# Patient Record
Sex: Female | Born: 1970 | Race: White | Hispanic: No | Marital: Married | Smoking: Former smoker
Health system: Southern US, Community
[De-identification: ages and names within clinical notes are randomized; demographics above are authoritative.]

## PROBLEM LIST (undated history)

## (undated) DIAGNOSIS — F32A Depression, unspecified: Secondary | ICD-10-CM

## (undated) DIAGNOSIS — F329 Major depressive disorder, single episode, unspecified: Secondary | ICD-10-CM

## (undated) DIAGNOSIS — A64 Unspecified sexually transmitted disease: Secondary | ICD-10-CM

## (undated) DIAGNOSIS — M1990 Unspecified osteoarthritis, unspecified site: Secondary | ICD-10-CM

## (undated) DIAGNOSIS — E0789 Other specified disorders of thyroid: Secondary | ICD-10-CM

## (undated) DIAGNOSIS — F988 Other specified behavioral and emotional disorders with onset usually occurring in childhood and adolescence: Secondary | ICD-10-CM

## (undated) DIAGNOSIS — G43109 Migraine with aura, not intractable, without status migrainosus: Secondary | ICD-10-CM

## (undated) DIAGNOSIS — E063 Autoimmune thyroiditis: Secondary | ICD-10-CM

## (undated) DIAGNOSIS — Z803 Family history of malignant neoplasm of breast: Secondary | ICD-10-CM

## (undated) DIAGNOSIS — F419 Anxiety disorder, unspecified: Secondary | ICD-10-CM

## (undated) HISTORY — DX: Anxiety disorder, unspecified: F41.9

## (undated) HISTORY — PX: BREAST EXCISIONAL BIOPSY: SUR124

## (undated) HISTORY — PX: APPENDECTOMY: SHX54

## (undated) HISTORY — DX: Depression, unspecified: F32.A

## (undated) HISTORY — DX: Other specified disorders of thyroid: E07.89

## (undated) HISTORY — PX: BREAST FIBROADENOMA SURGERY: SHX580

## (undated) HISTORY — DX: Unspecified sexually transmitted disease: A64

## (undated) HISTORY — DX: Family history of malignant neoplasm of breast: Z80.3

## (undated) HISTORY — PX: OTHER SURGICAL HISTORY: SHX169

## (undated) HISTORY — DX: Migraine with aura, not intractable, without status migrainosus: G43.109

## (undated) HISTORY — DX: Autoimmune thyroiditis: E06.3

## (undated) HISTORY — DX: Other specified behavioral and emotional disorders with onset usually occurring in childhood and adolescence: F98.8

## (undated) HISTORY — PX: TUBAL LIGATION: SHX77

## (undated) HISTORY — DX: Unspecified osteoarthritis, unspecified site: M19.90

## (undated) HISTORY — PX: LAPAROSCOPY: SHX197

## (undated) HISTORY — DX: Major depressive disorder, single episode, unspecified: F32.9

---

## 2000-07-15 HISTORY — PX: COLONOSCOPY: SHX174

## 2000-09-23 ENCOUNTER — Ambulatory Visit (HOSPITAL_COMMUNITY): Admission: RE | Admit: 2000-09-23 | Discharge: 2000-09-23 | Payer: Self-pay | Admitting: Gastroenterology

## 2001-02-16 ENCOUNTER — Ambulatory Visit (HOSPITAL_COMMUNITY): Admission: RE | Admit: 2001-02-16 | Discharge: 2001-02-16 | Payer: Self-pay | Admitting: Obstetrics and Gynecology

## 2001-06-02 ENCOUNTER — Encounter: Payer: Self-pay | Admitting: Emergency Medicine

## 2001-06-02 ENCOUNTER — Emergency Department (HOSPITAL_COMMUNITY): Admission: EM | Admit: 2001-06-02 | Discharge: 2001-06-02 | Payer: Self-pay | Admitting: Emergency Medicine

## 2002-03-22 ENCOUNTER — Other Ambulatory Visit: Admission: RE | Admit: 2002-03-22 | Discharge: 2002-03-22 | Payer: Self-pay | Admitting: *Deleted

## 2003-06-15 ENCOUNTER — Other Ambulatory Visit: Admission: RE | Admit: 2003-06-15 | Discharge: 2003-06-15 | Payer: Self-pay | Admitting: Obstetrics and Gynecology

## 2003-09-12 ENCOUNTER — Encounter: Admission: RE | Admit: 2003-09-12 | Discharge: 2003-09-12 | Payer: Self-pay | Admitting: Family Medicine

## 2003-09-22 ENCOUNTER — Encounter: Payer: Self-pay | Admitting: Family Medicine

## 2003-09-23 ENCOUNTER — Ambulatory Visit (HOSPITAL_COMMUNITY): Admission: RE | Admit: 2003-09-23 | Discharge: 2003-09-23 | Payer: Self-pay | Admitting: Family Medicine

## 2007-08-06 ENCOUNTER — Ambulatory Visit: Payer: Self-pay | Admitting: Family Medicine

## 2007-09-16 ENCOUNTER — Ambulatory Visit (HOSPITAL_COMMUNITY): Admission: RE | Admit: 2007-09-16 | Discharge: 2007-09-16 | Payer: Self-pay | Admitting: Family Medicine

## 2007-12-17 ENCOUNTER — Ambulatory Visit: Payer: Self-pay | Admitting: Family Medicine

## 2008-02-10 ENCOUNTER — Other Ambulatory Visit: Admission: RE | Admit: 2008-02-10 | Discharge: 2008-02-10 | Payer: Self-pay | Admitting: Obstetrics and Gynecology

## 2008-02-10 ENCOUNTER — Other Ambulatory Visit: Admission: RE | Admit: 2008-02-10 | Discharge: 2008-02-10 | Payer: Self-pay | Admitting: Obstetrics & Gynecology

## 2008-04-06 ENCOUNTER — Ambulatory Visit: Payer: Self-pay | Admitting: Family Medicine

## 2008-07-12 ENCOUNTER — Ambulatory Visit: Payer: Self-pay | Admitting: Family Medicine

## 2008-11-07 ENCOUNTER — Ambulatory Visit: Payer: Self-pay | Admitting: Family Medicine

## 2008-11-16 ENCOUNTER — Other Ambulatory Visit: Admission: RE | Admit: 2008-11-16 | Discharge: 2008-11-16 | Payer: Self-pay | Admitting: Obstetrics and Gynecology

## 2008-11-29 ENCOUNTER — Encounter: Admission: RE | Admit: 2008-11-29 | Discharge: 2008-11-29 | Payer: Self-pay | Admitting: Family Medicine

## 2009-02-08 ENCOUNTER — Ambulatory Visit: Payer: Self-pay | Admitting: Family Medicine

## 2009-03-23 ENCOUNTER — Emergency Department (HOSPITAL_COMMUNITY): Admission: EM | Admit: 2009-03-23 | Discharge: 2009-03-23 | Payer: Self-pay | Admitting: Family Medicine

## 2009-06-15 ENCOUNTER — Ambulatory Visit: Payer: Self-pay | Admitting: Family Medicine

## 2009-10-27 ENCOUNTER — Ambulatory Visit: Payer: Self-pay | Admitting: Family Medicine

## 2010-02-19 ENCOUNTER — Ambulatory Visit: Payer: Self-pay | Admitting: Physician Assistant

## 2010-07-24 ENCOUNTER — Ambulatory Visit
Admission: RE | Admit: 2010-07-24 | Discharge: 2010-07-24 | Payer: Self-pay | Source: Home / Self Care | Attending: Family Medicine | Admitting: Family Medicine

## 2010-08-05 ENCOUNTER — Encounter: Payer: Self-pay | Admitting: Family Medicine

## 2010-11-30 NOTE — Op Note (Signed)
[facility]  Patient:    Nichole Campos, Nichole Campos                      MRN: [id number] Proc. Date: 02/16/01 Adm. Date:  [id number] Attending:  Brynda Peon                           Operative Report  PREOPERATIVE DIAGNOSES:       1. Desire for attempt at permanent surgical                                  sterilization.                               2. Left lower quadrant pain.                               3. History of endometriosis, rule out recurrent                                  endometriosis.  POSTOPERATIVE DIAGNOSES:      1. Desires attempt at permanent surgical                                  sterilization.                               2. Left lower quadrant pain.                               3. Pelvic adhesions.                               4. No evidence of endometriosis.  OPERATION:                    Laparoscopic fallopian ring bilateral tubal                               sterilization procedure and lysis of adhesions.  SURGEON:                      Cynthia P. Ashley Royalty, M.D.  ANESTHESIA:                   General endotracheal.  ESTIMATED BLOOD LOSS:         Minimal.  COMPLICATIONS:                None.  DESCRIPTION OF PROCEDURE:     The patient was taken to the operating room and after the induction of adequate general endotracheal anesthesia, was placed in the dorsolithotomy position, and prepped and draped in the usual fashion.  An infraumbilical incision was made at the site of the previous laparoscopic incision and the Veress needle was inserted into the peritoneal space.  Proper placement was tested by noting free flow of saline through the  Veress needle with a negative aspirate, and then by noting the response of a drop of saline placed at the hub of the Veress needle to negative pressure as the abdominal wall was elevated.  Pneumoperitoneum was created with 2.5 L of CO2 using the automatic insufflator.  Disposable  10 and 11 mm trocars were inserted into the peritoneal space and its proper placement noted with the laparoscope.  The pelvis was inspected.  The patient was placed in deep Trendelenburg.  The uterus, ovaries, and tubes were able to be visualized at this time, and no evidence of any abnormalities was noted.  The posterior cul-de-sac was clear. There was no endometriosis involving the rectum that could be identified.  A midline suprapubic incision was made and an 8 mm trocar was inserted under direct visualization.  The right fallopian tube was identified and traced to its fimbriated end.  The ________ was elevated and a fallopian ring was placed.  A good knuckle of tube was noted to be contained within the ring. Good blanching was noted and no bleeding was encountered.  The procedure was repeated on the patients left, identifying the tube, and tracing it to its fimbriated end.  The _________ portion was elevated and a fallopian ring was placed.  A good knuckle of tube was noted to be contained within the ring. Good blanching was noted and no bleeding was encountered.  The remainder of the abdomen and pelvis was then inspected.  The posterior cul-de-sac was absolutely clear as was the anterior cul-de-sac.  There was no evidence of endometriosis on either ovary or tube.  On the patients left side, there were some adhesions between the appendices ________ and the pelvic side wall.  They were filmy and because it was felt that that was the only abnormality that could be seen on the patients left side that could account in any way for the pain she was experiencing, it was decided to take these adhesions.  A 5 mm port was then inserted and a 5 mm trocar inserted under direct visualization.  Unipolar scissors were used to take down the adhesions sharply.  Following completion of the lysis of adhesions, the instruments were removed from the abdomen.  The pneumoperitoneum was allowed to escape.   The incisions were subcuticularly with 3-0 Vicryl repeat.  Instruments were removed from the vagina.  The incisions were injected with 0.5% Marcaine with epinephrine for postoperative pain relief.  Steri-Strips were placed and the procedure was terminated.  The patient was taken to the recovery room in satisfactory condition. DD:  02/16/01 TD:  02/17/01 Job: [id number]

## 2010-11-30 NOTE — Procedures (Signed)
[place]  Patient:    Nichole Campos, Nichole Campos                        MRN: [id number] Proc. Date: 09/23/00 Adm. Date:  [id number] Attending:  Charna Elizabeth CC:         Doreatha Lew, M.D.                           Procedure Report  DATE OF BIRTH:  1970/08/26.  PROCEDURE:  Colonoscopy.  ENDOSCOPIST:  Anselmo Rod, M.D.  INSTRUMENT USED:  Olympus video colonoscope.  INDICATION FOR PROCEDURE:  Blood and mucus in the stool with diarrhea in a 40 year old.  Rule out IBD.  PREPROCEDURE PREPARATION:  Informed consent was procured from the patient. The patient was fasted for eight hours prior to the procedure and prepped with a bottle of magnesium citrate and a gallon of NuLytely the night prior to the procedure.  PREPROCEDURE PHYSICAL:  VITAL SIGNS:  The patient had stable vital signs.  NECK:  Supple.  CHEST:  Clear to auscultation.  S1, S2 regular.  ABDOMEN:  Soft with normal abdominal bowel sounds.  DESCRIPTION OF PROCEDURE:  The patient was placed in the left lateral decubitus position and sedated with 100 mg of Demerol and 8 mg of Versed intravenously.  Once the patient was adequately sedate and maintained on low-flow oxygen and continuous cardiac monitoring, the Olympus video colonoscope was advanced from the rectum to the cecum and terminal ileum without difficulty.  The entire colonic mucosa and the terminal ileum appeared normal without erosions, ulcerations, masses, or polyps.  No abnormality was noticed, and the mucosa had a healthy vascular pattern.  The appendiceal orifice and the ileocecal valve were clearly visualized.  The patient had small, nonbleeding internal hemorrhoids seen on retroflexion in the rectum. The patient tolerated the procedure well without complication.  IMPRESSION: 1. Normal colonoscopy up to the terminal ileum. 2. Small, nonbleeding internal hemorrhoids.  RECOMMENDATIONS: 1. 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  Therefore, a    high-fiber diet with liberal fluid intake has been advocated.  There is no    evidence of IBD, and therefore fiber and fluid supplementation will be the    treatment of choice. 2. Outpatient follow-up is advised in the next two to four weeks or earlier if    need be. DD:  09/23/00 TD:  09/23/00 Job: [id number]

## 2011-06-30 ENCOUNTER — Ambulatory Visit: Payer: Self-pay | Admitting: Rheumatology

## 2012-01-22 ENCOUNTER — Other Ambulatory Visit (HOSPITAL_COMMUNITY): Payer: Self-pay | Admitting: Endocrinology

## 2012-01-22 DIAGNOSIS — E042 Nontoxic multinodular goiter: Secondary | ICD-10-CM

## 2012-01-29 ENCOUNTER — Other Ambulatory Visit (HOSPITAL_COMMUNITY): Payer: Self-pay

## 2012-02-26 ENCOUNTER — Other Ambulatory Visit (HOSPITAL_COMMUNITY): Payer: Self-pay

## 2012-10-08 DIAGNOSIS — E039 Hypothyroidism, unspecified: Secondary | ICD-10-CM | POA: Insufficient documentation

## 2012-10-08 DIAGNOSIS — F329 Major depressive disorder, single episode, unspecified: Secondary | ICD-10-CM | POA: Insufficient documentation

## 2012-11-18 DIAGNOSIS — R894 Abnormal immunological findings in specimens from other organs, systems and tissues: Secondary | ICD-10-CM | POA: Insufficient documentation

## 2012-12-09 ENCOUNTER — Ambulatory Visit: Payer: Self-pay | Admitting: Certified Nurse Midwife

## 2012-12-18 DIAGNOSIS — G2581 Restless legs syndrome: Secondary | ICD-10-CM | POA: Insufficient documentation

## 2013-01-26 ENCOUNTER — Encounter: Payer: Self-pay | Admitting: Certified Nurse Midwife

## 2013-02-03 ENCOUNTER — Ambulatory Visit: Payer: Self-pay | Admitting: Certified Nurse Midwife

## 2013-02-22 ENCOUNTER — Encounter: Payer: Self-pay | Admitting: Certified Nurse Midwife

## 2013-02-23 ENCOUNTER — Ambulatory Visit: Payer: Self-pay | Admitting: Certified Nurse Midwife

## 2013-05-05 ENCOUNTER — Encounter: Payer: Self-pay | Admitting: Certified Nurse Midwife

## 2013-05-05 ENCOUNTER — Ambulatory Visit (INDEPENDENT_AMBULATORY_CARE_PROVIDER_SITE_OTHER): Payer: BC Managed Care – PPO | Admitting: Certified Nurse Midwife

## 2013-05-05 VITALS — BP 110/72 | HR 72 | Resp 16 | Ht 65.0 in | Wt 189.0 lb

## 2013-05-05 DIAGNOSIS — Z0000 Encounter for general adult medical examination without abnormal findings: Secondary | ICD-10-CM

## 2013-05-05 DIAGNOSIS — Z01419 Encounter for gynecological examination (general) (routine) without abnormal findings: Secondary | ICD-10-CM

## 2013-05-05 NOTE — Progress Notes (Signed)
Patient ID: Nichole Campos, female   DOB: Jan 30, 1971, 42 y.o.   MRN: [id number] 42 y.o. G60P1001 Married Caucasian Fe here for annual exam.  Periods normal, no issues. Saw infertility, with Hyperthyroid diagnosis and Dr.Smith endocrine for management. Feeling much better. Social stress in family.  Patient's last menstrual period was 04/17/2013.          Sexually active: yes  The current method of family planning is tubal ligation.    Exercising: yes  Home exercise routine includes walking ? hrs per day. Smoker:  no   Health Maintenance: Pap:  12/04/11, WNL, neg HR HPV MMG:  10/19/10, Bi-Rads 3: probable benign Colonoscopy:  2002, normal, rpt at 52 y0 TDaP:  2008 Labs: PCP   reports that she quit smoking about 21 years ago. Her smoking use included Cigarettes. She started smoking about 22 years ago. She smoked 0.00 packs per day. She does not have any smokeless tobacco history on file. She reports that she drinks alcohol. She reports that she does not use illicit drugs.  Past Medical History  Diagnosis Date  . Migraines     as a teenager  . STD (sexually transmitted disease)     HSV1  . 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 Depression     Past Surgical History  Procedure Laterality Date  . Appendectomy    . Arm surgery      age 73 rt arm surgery  . Tubal ligation      BTL with bands  . Breast fibroadenoma surgery Left     age 89    Current Outpatient Prescriptions  Medication Sig Dispense Refill  . Acetaminophen (TYLENOL PO) Take by mouth as needed.      . ALPRAZOLAM PO Take 0.5 mg by mouth 2 (two) times daily as needed.      Marland Kitchen CALCIUM PO Take by mouth daily.      . cetirizine (ZYRTEC) 10 MG tablet Take 10 mg by mouth daily.      Marland Kitchen desvenlafaxine (PRISTIQ) 50 MG 24 hr tablet Take 50 mg by mouth daily.      Marland Kitchen levothyroxine (SYNTHROID, LEVOTHROID) 25 MCG tablet Take 25 mcg by mouth daily before breakfast.      . lisdexamfetamine (VYVANSE) 40  MG capsule Take 40 mg by mouth every morning.      . Multiple Vitamins-Minerals (MULTIVITAMIN PO) Take by mouth daily.      . Naproxen Sodium (ALEVE PO) Take by mouth as needed.       No current facility-administered medications for this visit.    Family History  Problem Relation Age of Onset  . Hypertension Mother   . Thyroid disease Mother   . Thyroid disease Sister   . Breast cancer Paternal Grandmother     ROS:  Pertinent items are noted in HPI.  Otherwise, a comprehensive ROS was negative.  Exam:   BP 110/72  Pulse 72  Resp 16  Ht 5\' 5"  (1.651 m)  Wt 189 lb (85.73 kg)  BMI 31.45 kg/m2  LMP 04/17/2013 Height: 5\' 5"  (165.1 cm)  Ht Readings from Last 3 Encounters:  05/05/13 5\' 5"  (1.651 m)    General appearance: alert, cooperative and appears stated age Head: Normocephalic, without obvious abnormality, atraumatic Neck: no adenopathy, supple, symmetrical, trachea midline and thyroid enlarged bilateral with nodules palpated. Lungs: clear to auscultation bilaterally Breasts: normal appearance, no masses or tenderness, No nipple retraction or dimpling, No nipple  discharge or bleeding, No axillary or supraclavicular adenopathy Heart: regular rate and rhythm Abdomen: soft, non-tender; no masses,  no organomegaly Extremities: extremities normal, atraumatic, no cyanosis or edema Skin: Skin color, texture, turgor normal. No rashes or lesions Lymph nodes: Cervical, supraclavicular, and axillary nodes normal. No abnormal inguinal nodes palpated Neurologic: Grossly normal   Pelvic: External genitalia:  no lesions              Urethra:  normal appearing urethra with no masses, tenderness or lesions              Bartholin's and Skene's: normal                 Vagina: normal appearing vagina with normal color and discharge, no lesions              Cervix: normal, non tender              Pap taken: no Bimanual Exam:  Uterus:  normal size, contour, position, consistency, mobility,  non-tender and anteverted              Adnexa: normal adnexa and no mass, fullness, tenderness               Rectovaginal: Confirms               Anus:  normal sphincter tone, no lesions  A:  Well Woman with normal exam  Contraception BTL  Hyperthyroid/Hypothyroid under endocrine management unstable medication management  Thyroid biopsy 2013 negative  P:   Reviewed health and wellness pertinent to exam  Continue follow up as indicated  Pap smear as per guidelines   Mammogram yearly has information to schedule pap smear not taken today  counseled on breast self exam, mammography screening, adequate intake of calcium and vitamin D, diet and exercise  return annually or prn  An After Visit Summary was printed and given to the patient.

## 2013-05-05 NOTE — Patient Instructions (Signed)

## 2013-05-09 NOTE — Progress Notes (Signed)
Note reviewed, agree with plan.  Paylin Hailu, MD  

## 2013-07-15 HISTORY — PX: BREAST BIOPSY: SHX20

## 2013-08-11 ENCOUNTER — Telehealth: Payer: Self-pay | Admitting: Certified Nurse Midwife

## 2013-08-11 NOTE — Telephone Encounter (Signed)
Patient is requesting a referral for BRCA testing due to abnormal mammogram.

## 2013-08-11 NOTE — Telephone Encounter (Signed)
Patient is asking for a referral for BRCA testing due to her abnormal mammogram.

## 2013-08-11 NOTE — Telephone Encounter (Signed)
If patient has not had a mammogram yet as we discussed, because I can not find one epic, she needs one done now, schedule for her if needed. If she has I need the report to make a decision

## 2013-08-11 NOTE — Telephone Encounter (Signed)
LM for pt that her request will be forwarded to DL and we will contact her with her decision re: referral.

## 2013-08-16 NOTE — Telephone Encounter (Signed)
Patient has had Mammogram/follow up imaging done. 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 I keep extra copies while I wait for scan so I will place them to your desk.

## 2013-08-17 ENCOUNTER — Telehealth: Payer: Self-pay | Admitting: Genetic Counselor

## 2013-08-17 NOTE — Telephone Encounter (Signed)
Patient calling to check on status of this call.

## 2013-08-17 NOTE — Telephone Encounter (Signed)
Spoke with Nichole Campos CNM okay to send for Genetic Counseling.  Spoke with Jonelle Sidle, scheduler at [place] at [facility]: First available appointment for Genetics Counseling is 10/21/13 at 1:00 which was scheduled for her.  Left detailed message per patient request with the appointment information. Advised to call back with questions.   Routing to provider for final review. Patient agreeable to disposition. Will close encounter

## 2013-08-17 NOTE — Telephone Encounter (Signed)
Nichole Campos TO SCHEDULE GENETIC APPT 04/09 @ 1 W/KAREN POWELL.  WELCOME PACKET MAILED TO PATIENT

## 2013-09-08 ENCOUNTER — Encounter: Payer: Self-pay | Admitting: Certified Nurse Midwife

## 2013-10-08 ENCOUNTER — Telehealth: Payer: Self-pay | Admitting: *Deleted

## 2013-10-08 NOTE — Telephone Encounter (Signed)
Called pt to inform her that Nichole Campos is no longer here and that she would be seeing Ofri and that I needed her to come in a little sooner.  Confirmed 12:30pm genetic appt on 4/9 w/ pt.

## 2013-10-21 ENCOUNTER — Other Ambulatory Visit: Payer: Self-pay

## 2013-10-21 ENCOUNTER — Encounter: Payer: Self-pay | Admitting: Genetic Counselor

## 2013-10-21 ENCOUNTER — Ambulatory Visit (HOSPITAL_BASED_OUTPATIENT_CLINIC_OR_DEPARTMENT_OTHER): Payer: Self-pay | Admitting: Genetic Counselor

## 2013-10-21 ENCOUNTER — Other Ambulatory Visit: Payer: BC Managed Care – PPO

## 2013-10-21 DIAGNOSIS — Z803 Family history of malignant neoplasm of breast: Secondary | ICD-10-CM | POA: Insufficient documentation

## 2013-10-21 NOTE — Progress Notes (Signed)
Patient Name: Nichole Campos Patient Age: 43 y.o. Encounter Date: 10/21/2013  Referring Physician: No referring provider defined for this encounter.  Primary Care Provider: No PCP Per Patient   Ms. Nichole Campos, a 43 y.o. female, is being seen at the [facility] due to a family history of breast cancer.  She presents to clinic today with her husband, Nichole Campos, to discuss the possibility of a hereditary predisposition to cancer and discuss whether genetic testing is warranted.  HISTORY OF PRESENT ILLNESS: Ms. Nichole Campos has no personal history of cancer and is in generally good health. She recently had a suspicious screening mammogram that required a diagnostic mammogram and then a biopsy. We do not have the path for review today, but she states it was benign. She had a left breast fibroadenoma removed at age 47.  Ms. Nichole Campos reports that she has a yearly gynecologic exam and clinical breast exam. She had a colonoscopy at age 51 due to abdominal cramping, but reports that it was negative for polyps.   Past Medical History  Diagnosis Date  . Migraines     as a teenager  . STD (sexually transmitted disease)     HSV1  . 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 Depression   . Family history of malignant neoplasm of breast     Past Surgical History  Procedure Laterality Date  . Appendectomy    . Arm surgery      age 43 rt arm surgery  . Tubal ligation      BTL with bands  . Breast fibroadenoma surgery Left     age 78   History   Social History  . Marital Status: Married    Spouse Name: N/A    Number of Children: 1  . Years of Education: N/A   Occupational History  .     Social History Main Topics  . Smoking status: Former Smoker    Types: Cigarettes    Start date: 07/15/1990    Quit date: 07/16/1991  . Smokeless tobacco: Not on file  . Alcohol Use: 0.0 - 0.5 oz/week    0-1 drink(s) per week  . Drug Use: No  . Sexual  Activity: Yes    Partners: Male    Birth Control/ Protection: Surgical     Comment: BTL   Other Topics Concern  . Not on file   Social History Narrative  . No narrative on file     FAMILY HISTORY:   During the visit, a 4-generation pedigree was obtained. Significant diagnoses include the following:  Family History  Problem Relation Age of Onset  . Hypertension Mother   . Thyroid disease Mother   . Thyroid disease Sister   . Breast cancer Paternal Grandmother     40s; deceased    Additionally, it should be noted that her father had only one sister (age 53) who is cancer-free. Her mother (age 26) is cancer-free and was an only child.  [name] ancestry is [place]. There is no known Jewish ancestry and no consanguinity.  ASSESSMENT AND PLAN: Ms. Nichole Campos is a 43 y.o. female with a family history of breast cancer in her paternal grandmother in her 64s. This history is not suggestive of a hereditary predisposition to cancer. Ms. Nichole Campos is worried, however, given the limited size of her family and is interested in genetic testing. We specifically discussed testing of the BRCA1 and BRCA2 genes. We reviewed the characteristics,  features and inheritance patterns of hereditary cancer syndromes. We also discussed genetic testing, including the appropriate family members to test, the process of testing, insurance coverage and implications of results. We reviewed the limitations of genetic testing in an unaffected individual and that her insurance may not cover the cost of testing.  Ms. Nichole Campos wished to pursue genetic testing and a blood sample will be sent to [facility] for analysis of the BRCA1 and BRCA2. We discussed the implications of a positive, negative and/ or Variant of Uncertain Significance (VUS) result. Results should be available in approximately 2-3 weeks, at which point we will contact her and address implications for her as well as address genetic testing for  at-risk family members, if needed.    We encouraged Ms. Nichole Campos to remain in contact with Cancer Genetics annually so that we can update the family history and inform her of any changes in cancer genetics and testing that may be of benefit for this family. Ms.  Nichole Campos questions were answered to her satisfaction today.   Thank you for the referral and allowing [place] to share in the care of your patient.   The patient was seen for a total of 25 minutes, greater than 50% of which was spent face-to-face counseling. This patient was discussed with the referring provider who agrees with the above.

## 2013-11-09 ENCOUNTER — Encounter: Payer: Self-pay | Admitting: Genetic Counselor

## 2013-11-09 NOTE — Progress Notes (Signed)
GENETIC TESTING:  At the time of Ms. Jean's visit on 10/21/13, we recommended she pursue genetic testing of the BRCA1 and BRCA2 genes. This test, which included sequencing and deletion/duplication testing, was performed at [facility]. Testing was normal and did not reveal a mutation in these genes.   We discussed with Ms. Mayall that since the current test is not perfect, it is possible there may be a gene mutation that current testing cannot detect, but that chance is small.  We also discussed that it is possible that a different genetic factor, which has not yet been discovered, is responsible for the cancer diagnoses in the family. Again, the likelihood of this is low given her family history.  ADDITIONAL GENETIC TESTING:  We discussed with Ms. Molitor that there are other genes that are associated with increased cancer risk that can be analyzed. The laboratories that offer such testing look at these additional genes via a hereditary cancer gene panel. Should Ms. Padula wish to discuss this additional testing with [place] or pursue such testing, we are happy to coordinate this at any time, but do not feel that she is at significant risk of harboring a mutation in a different gene.     CANCER SCREENING:  This normal result is reassuring and indicates that Ms. Spacek does not likely have an increased risk of cancer due to a mutation in one of these genes. We recommended Ms. Donahey continue to follow the cancer screening guidelines provided by her primary physician.   Lastly, we discussed with Ms. Constante that cancer genetics is a rapidly advancing field and it is possible that new genetic tests will be appropriate for her in the future. We encouraged her to remain in contact with [place] on an annual basis so we can update her personal and family histories, and let her know of advances in cancer genetics that may benefit the family. Our contact number was provided. Ms. Agramonte questions were answered to her  satisfaction today, and she knows she is welcome to call anytime with additional questions.    Steele Berg, MS, [place] Certified Genetic Counseor phone: [phone] [email or web address]

## 2014-01-17 ENCOUNTER — Telehealth: Payer: Self-pay | Admitting: Certified Nurse Midwife

## 2014-01-17 NOTE — Telephone Encounter (Signed)
Order for review and signature to Regina Eck CNM desk.

## 2014-01-17 NOTE — Telephone Encounter (Signed)
Patient needs an order sent to [facility] for her upcoming [facility] appointment 01/26/14.

## 2014-01-18 NOTE — Telephone Encounter (Signed)
Order faxed to [facility] with fax confirmation received. Spoke with patient. Advised order sent to [facility] for upcoming appointment. Patient agreeable.  Routing to provider for final review. Patient agreeable to disposition. Will close encounter

## 2014-05-11 ENCOUNTER — Ambulatory Visit: Payer: BC Managed Care – PPO | Admitting: Certified Nurse Midwife

## 2014-06-24 ENCOUNTER — Ambulatory Visit: Payer: BC Managed Care – PPO | Admitting: Certified Nurse Midwife

## 2014-07-27 HISTORY — PX: COLONOSCOPY: SHX174

## 2014-08-10 ENCOUNTER — Ambulatory Visit (INDEPENDENT_AMBULATORY_CARE_PROVIDER_SITE_OTHER): Payer: BLUE CROSS/BLUE SHIELD | Admitting: Certified Nurse Midwife

## 2014-08-10 ENCOUNTER — Encounter: Payer: Self-pay | Admitting: Certified Nurse Midwife

## 2014-08-10 VITALS — BP 110/60 | HR 70 | Resp 16 | Ht 64.75 in | Wt 166.8 lb

## 2014-08-10 DIAGNOSIS — N390 Urinary tract infection, site not specified: Secondary | ICD-10-CM

## 2014-08-10 DIAGNOSIS — Z01419 Encounter for gynecological examination (general) (routine) without abnormal findings: Secondary | ICD-10-CM

## 2014-08-10 DIAGNOSIS — Z124 Encounter for screening for malignant neoplasm of cervix: Secondary | ICD-10-CM

## 2014-08-10 DIAGNOSIS — Z0000 Encounter for general adult medical examination without abnormal findings: Secondary | ICD-10-CM

## 2014-08-10 LAB — POCT URINALYSIS DIPSTICK
PH UA: 5
Urobilinogen, UA: NEGATIVE

## 2014-08-10 NOTE — Progress Notes (Signed)
44 y.o. G24P1001 Married  Caucasian Fe here for annual exam. Periods regular slightly heavier, but no issues.  Complaining of painful urination after colonoscopy that has not resolved. Frequency old problem slightly increased and not emptying but small amount. Patient feels urine is concentrated and has been trying increase fluids. No fever or chills, slight back pain. Colonoscopy showed small polyp. Under follow up. Sees PCP yearly and medication management.. Has lost 23 pounds in past year! No other health issues today.    Patient's last menstrual period was 07/21/2014.          Sexually active: Yes.    The current method of family planning is Tubal Ligation.    Exercising: No.  The patient does not participate in regular exercise at present. Smoker:  no  Health Maintenance: Pap:  12/04/11 NEG HR HPV negative MMG:  07/21/13; 07/26/13 right breast bx- benign usual ductal hyperplasia;  01/26/14 f/u of benign breast bx- Bi-Rads 2: Benign ; needs to schedule mammogram for this year. Colonoscopy:  07/27/14 inflammatory  polyp f/u in 9 years BMD:   Never had one TDaP:  2018 Labs: Jerilee Hoh , MD ; Urine: Trace Protein, WBC'S +   reports that she quit smoking about 23 years ago. Her smoking use included Cigarettes. She started smoking about 24 years ago. She does not have any smokeless tobacco history on file. She reports that she drinks alcohol. She reports that she does not use illicit drugs.  Past Medical History  Diagnosis Date  . Migraines     as a teenager  . STD (sexually transmitted disease)     HSV1  . 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 Depression   . Family history of malignant neoplasm of breast     Past Surgical History  Procedure Laterality Date  . Appendectomy    . Arm surgery      age 88 rt arm surgery  . Tubal ligation      BTL with bands  . Breast fibroadenoma surgery Left     age 82    Current Outpatient Prescriptions  Medication Sig  Dispense Refill  . Acetaminophen (TYLENOL PO) Take by mouth as needed.    . ALPRAZolam (XANAX) 0.5 MG tablet Take 0.5 mg by mouth.    Marland Kitchen CALCIUM PO Take by mouth daily.    . cetirizine (ZYRTEC) 10 MG tablet Take 10 mg by mouth daily.    Marland Kitchen desvenlafaxine (PRISTIQ) 50 MG 24 hr tablet Take 50 mg by mouth.    . gabapentin (NEURONTIN) 100 MG capsule Take 100 mg by mouth 2 (two) times daily. Patient takes 1 tablet by mouth twice daily as needed.    Marland Kitchen levothyroxine (SYNTHROID, LEVOTHROID) 25 MCG tablet     . lisdexamfetamine (VYVANSE) 50 MG capsule     . Multiple Vitamins-Minerals (MULTIVITAMIN PO) Take by mouth daily.    . Naproxen Sodium (ALEVE PO) Take by mouth as needed.    . zolpidem (AMBIEN) 10 MG tablet Take 10 mg by mouth Nightly.    Marland Kitchen ALPRAZolam (XANAX) 0.5 MG tablet Take 0.5 mg by mouth 2 (two) times daily.  5  . VYVANSE 60 MG capsule Take 60 mg by mouth every morning.  0   No current facility-administered medications for this visit.    Family History  Problem Relation Age of Onset  . Hypertension Mother   . Thyroid disease Mother   . Thyroid disease Sister   .  Breast cancer Paternal Grandmother     26s; deceased    ROS:  Pertinent items are noted in HPI.  Otherwise, a comprehensive ROS was negative.  Exam:   BP 110/60 mmHg  Pulse 70  Resp 16  Ht 5' 4.75" (1.645 m)  Wt 166 lb 12.8 oz (75.66 kg)  BMI 27.96 kg/m2  LMP 07/21/2014 Height: 5' 4.75" (164.5 cm) Ht Readings from Last 3 Encounters:  08/10/14 5' 4.75" (1.645 m)  05/05/13 5\' 5"  (1.651 m)    General appearance: alert, cooperative and appears stated age Head: Normocephalic, without obvious abnormality, atraumatic Neck: no adenopathy, supple, symmetrical, trachea midline and thyroid very enlarged, ? Change, recent US with endocrine no change per patient Lungs: clear to auscultation bilaterally CVAT bilateral negative Breasts: normal appearance, no masses or tenderness, No nipple retraction or dimpling, No nipple  discharge or bleeding, No axillary or supraclavicular adenopathy Heart: regular rate and rhythm Abdomen: soft, positive suprapubic; no masses,  no organomegaly Extremities: extremities normal, atraumatic, no cyanosis or edema Skin: Skin color, texture, turgor normal. No rashes or lesions, warm and dry Lymph nodes: Cervical, supraclavicular, and axillary nodes normal. No abnormal inguinal nodes palpated Neurologic: Grossly normal   Pelvic: External genitalia:  no lesions              Urethra:  normal appearing urethra with no masses, slightly tender or lesions, Urethral meatus non tender  Bladder tender              Bartholin's and Skene's: normal                 Vagina: normal appearing vagina with normal color and discharge, no lesions              Cervix: normal, non tender no lesions              Pap taken: Yes.   Bimanual Exam:  Uterus:  normal size, contour, position, consistency, mobility, non-tender and anteverted              Adnexa: normal adnexa and no mass, fullness, tenderness               Rectovaginal: Confirms               Anus:  normal sphincter tone, no lesions   A:  Well Woman with normal exam  UTI  Recent colonoscopy with polyp noted under follow up  Recent Norovirus   Hyperthyroid under Endocrine management  Anxiety under PCP management with medication    P:   Reviewed health and wellness pertinent to exam  Reviewed findings and need for Rx. Warning signs of UTI given. Discussed need for increase water intake, Pedialyte to help with fatigue.  Rx Macrobid see order  Lab: Urine culture, micro  Pap smear taken today with HPV reflex  Continue follow up with PCP as needed   counseled on breast self exam, adequate intake of calcium and vitamin D, diet and exercise  return annually or prn  An After Visit Summary was printed and given to the patient.

## 2014-08-10 NOTE — Patient Instructions (Signed)

## 2014-08-11 ENCOUNTER — Telehealth: Payer: Self-pay | Admitting: Certified Nurse Midwife

## 2014-08-11 LAB — URINALYSIS, MICROSCOPIC ONLY
CASTS: NONE SEEN
Crystals: NONE SEEN

## 2014-08-11 MED ORDER — NITROFURANTOIN MONOHYD MACRO 100 MG PO CAPS: 100.0000 mg | ORAL_CAPSULE | Freq: Two times a day (BID) | ORAL | Status: DC

## 2014-08-11 NOTE — Telephone Encounter (Signed)
Patient was seen yesterday and says her prescription was not called to the pharmacy for [place]. Confirmed pharmacy on file.

## 2014-08-11 NOTE — Telephone Encounter (Signed)
Routed to provider for review, encounter closed.

## 2014-08-11 NOTE — Telephone Encounter (Signed)
Let patient know it has been sent

## 2014-08-11 NOTE — Progress Notes (Signed)
Reviewed personally.  M. Suzanne Mickle Campton, MD.  

## 2014-08-11 NOTE — Telephone Encounter (Signed)
Patient notified that rx has been sent. 

## 2014-08-11 NOTE — Telephone Encounter (Signed)
Ms. Nichole Campos please advise, patient was seen yesterday for AEX.

## 2014-08-12 LAB — URINE CULTURE
Colony Count: NO GROWTH
ORGANISM ID, BACTERIA: NO GROWTH

## 2014-08-12 LAB — IPS PAP TEST WITH REFLEX TO HPV

## 2014-09-13 HISTORY — PX: BREAST BIOPSY: SHX20

## 2014-10-19 ENCOUNTER — Other Ambulatory Visit: Payer: Self-pay | Admitting: General Surgery

## 2014-10-19 ENCOUNTER — Encounter: Payer: Self-pay | Admitting: General Surgery

## 2014-10-19 ENCOUNTER — Ambulatory Visit (INDEPENDENT_AMBULATORY_CARE_PROVIDER_SITE_OTHER): Payer: BLUE CROSS/BLUE SHIELD | Admitting: General Surgery

## 2014-10-19 VITALS — BP 120/78 | HR 82 | Resp 12 | Ht 64.0 in | Wt 169.0 lb

## 2014-10-19 DIAGNOSIS — K8020 Calculus of gallbladder without cholecystitis without obstruction: Secondary | ICD-10-CM | POA: Diagnosis not present

## 2014-10-19 NOTE — Progress Notes (Signed)
Patient ID: Nichole Campos, female   DOB: 18-Nov-1970, 44 y.o.   MRN: [id number]  Chief Complaint  Patient presents with  . Other    gallstones    HPI Nichole Campos is a 44 y.o. female.  Here today for evaluation of gall stones. She states her pain started around September 2015.  The patient initially was having left-sided pain associated with diarrhea. This prompted her to undergo GI evaluation including a colonoscopy. No evidence of colitis. An inflammatory polyp was noted.  The pain comes and goes but she does notice the pain every am (cramping on the left abdomen) and worse after lunch.  In the last few months her pain is become more prominent in the epigastrium and right upper quadrant. She has had episodes where she is awakened from sleep. There is been no clear dietary pattern to the onset of her pain ports duration. No significant history of reflux or indigestion.  She did have a 3 day run of epigastric pain prior to having and ultrasound done 09-22-14.  CT scan ws January 2016.  HPI  Past Medical History  Diagnosis Date  . Migraines     as a teenager  . STD (sexually transmitted disease)     HSV1  . 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 Depression   . Family history of malignant neoplasm of breast   . Anxiety   . Arthritis     Past Surgical History  Procedure Laterality Date  . Arm surgery      age 18 rt arm surgery  . Tubal ligation      BTL with bands  . Breast fibroadenoma surgery Left     age 43  . Colonoscopy  07/27/2014    [facility]  . Breast biopsy Right Jan 2015  . Laparoscopy  1995? and 2002  . Appendectomy  1985?  Marland Kitchen Colonoscopy  2002    [facility]    Family History  Problem Relation Age of Onset  . Hypertension Mother   . Thyroid disease Mother   . Thyroid disease Sister   . Breast cancer Paternal Grandmother     71s; deceased    Social History History  Substance Use Topics  .  Smoking status: Former Smoker    Types: Cigarettes    Start date: 07/15/1990    Quit date: 07/16/1991  . Smokeless tobacco: Never Used  . Alcohol Use: 0.0 - 0.6 oz/week    0-1 Standard drinks or equivalent per week     Comment: 1 Per month    Allergies  Allergen Reactions  . Aspirin     GI bleeding  . Latex     irritation    Current Outpatient Prescriptions  Medication Sig Dispense Refill  . Acetaminophen (TYLENOL PO) Take by mouth as needed.    . ALPRAZolam (XANAX) 0.5 MG tablet Take 0.5 mg by mouth 2 (two) times daily as needed.     . cholecalciferol (VITAMIN D) 1000 UNITS tablet Take 1,000 Units by mouth daily.    Marland Kitchen desvenlafaxine (PRISTIQ) 50 MG 24 hr tablet Take 100 mg by mouth.     . gabapentin (NEURONTIN) 100 MG capsule Take 200 mg by mouth 2 (two) times daily. Patient takes 1 tablet by mouth twice daily as needed.    Marland Kitchen levothyroxine (SYNTHROID, LEVOTHROID) 25 MCG tablet     . lisdexamfetamine (VYVANSE) 50 MG capsule     . loratadine (CLARITIN)  10 MG tablet Take 10 mg by mouth daily.    . Multiple Vitamins-Minerals (MULTIVITAMIN PO) Take by mouth daily.    . Naproxen Sodium (ALEVE PO) Take by mouth as needed.    Marland Kitchen VYVANSE 60 MG capsule Take 60 mg by mouth every morning.  0  . zolpidem (AMBIEN) 10 MG tablet Take 10 mg by mouth Nightly.     No current facility-administered medications for this visit.    Review of Systems Review of Systems  Constitutional: Negative.   Respiratory: Negative.   Cardiovascular: Negative.   Gastrointestinal: Positive for abdominal pain and diarrhea. Negative for nausea and vomiting.    Blood pressure 120/78, pulse 82, resp. rate 12, height 5\' 4"  (1.626 m), weight 169 lb (76.658 kg), last menstrual period 10/10/2014.  Physical Exam Physical Exam  Constitutional: She is oriented to person, place, and time. She appears well-developed and well-nourished.  Neck: Neck supple.  Cardiovascular: Normal rate, regular rhythm and normal heart  sounds.   No lower leg edema.  Pulmonary/Chest: Effort normal and breath sounds normal.  Abdominal: Soft. Normal appearance and bowel sounds are normal. There is tenderness in the right upper quadrant and left lower quadrant.  Lymphadenopathy:    She has no cervical adenopathy.  Neurological: She is alert and oriented to person, place, and time.  Skin: Skin is warm and dry.    Data Reviewed Abdominal ultrasound dated 09/22/2014 reported cholelithiasis. The disc associated with this study was difficult to review, image by image, but multiple nodules along the gallbladder wall were appreciated. The remainder of the study was unremarkable by report. Laboratory studies dated 06/16/2014 showed a white blood cell count of 5300 with normal differential, hemoglobin of 13.4 with an MCV of 91. Normal electrolytes, renal function and liver functions. Lipase 23.  CT scan dated 06/18/2014 for left lower quadrant pain and diarrhea was not available for review. Impression: The colon was decompressed limiting evaluation. Possible wall thickening of the descending and sigmoid colon. 3 mm right lower lobe pulmonary nodule.  Laboratory studies dated 06/21/2014 showed stool sample negative for Salmonella, Shigella, Campylobacter. Enteropathic Escherichia coli negative. Clostridium toxin negative.  Colonoscopy biopsy dated 07/27/2014 random right colon biopsies no evidence of colitis, random left colon biopsies no evidence of colitis. Rectal biopsy: Inflammatory polyp.  Assessment    Cholelithiasis, likely low-grade cholecystitis.  Left lower quadrant pain, resolved.    Plan    The patient's more recent symptoms of epigastric and right upper quadrant pain, especially developing in the postprandial period (even without any consistent trigger foods) suggest symptomatic biliary tract disease. The patient has had laparoscopy and lysis of adhesions in the past with relief of left lower quadrant  symptoms.  Options for management were reviewed including observation versus elective cholecystectomy.  If tethering adhesions to the colon were identified, these will be lysed at the time of surgery.       Laparoscopic Cholecystectomy with Intraoperative Cholangiogram. The procedure, including it's potential risks and complications (including but not limited to infection, bleeding, injury to intra-abdominal organs or bile ducts, bile leak, poor cosmetic result, sepsis and death) were discussed with the patient in detail. Non-operative options, including their inherent risks (acute calculous cholecystitis with possible choledocholithiasis or gallstone pancreatitis, with the risk of ascending cholangitis, sepsis, and death) were discussed as well. The patient expressed and understanding of what we discussed and wishes to proceed with laparoscopic cholecystectomy. The patient further understands that if it is technically not possible, or it is unsafe  to proceed laparoscopically, that I will convert to an open cholecystectomy.  Patient's surgery has been scheduled for 11-10-14 at [facility].   PCP/Ref:  Patrick North 10/19/2014, 4:27 PM

## 2014-10-19 NOTE — Patient Instructions (Addendum)

## 2014-10-25 ENCOUNTER — Encounter: Payer: Self-pay | Admitting: General Surgery

## 2014-11-01 ENCOUNTER — Encounter: Payer: Self-pay | Admitting: General Surgery

## 2014-11-01 NOTE — Progress Notes (Signed)
The CT scan of the abdomen and pelvis dated 06/18/2014 was reviewed.  No findings outside of those suggested by the report of the same date. The 3 mm right lower lobe pulmonary nodule is not visible on my review/monitor.  The radiologist recommendation regarding the pulmonary nodule was for no follow-up if no risk factors for bronchogenic carcinoma. This seems reasonable.  PCP notes dated 06/15/2014 were reviewed. Included in that exam were laboratory studies including a normal comprehensive metabolic panel and CBC.

## 2014-11-10 ENCOUNTER — Ambulatory Visit
Admit: 2014-11-10 | Disposition: A | Discharge status: Home / Self Care | Payer: Self-pay | Attending: General Surgery | Admitting: General Surgery

## 2014-11-10 ENCOUNTER — Other Ambulatory Visit: Payer: Self-pay

## 2014-11-10 DIAGNOSIS — K8010 Calculus of gallbladder with chronic cholecystitis without obstruction: Secondary | ICD-10-CM | POA: Diagnosis not present

## 2014-11-10 HISTORY — PX: CHOLECYSTECTOMY: SHX55

## 2014-11-11 ENCOUNTER — Encounter: Payer: Self-pay | Admitting: General Surgery

## 2014-11-13 NOTE — Op Note (Signed)
PATIENT NAME:  Nichole Campos, Nichole Campos MR#:  [id number] DATE OF BIRTH:  June 11, 1971  DATE OF PROCEDURE:  11/10/2014.  PREOPERATIVE DIAGNOSES:  Chronic cholecystitis and cholelithiasis.   POSTOPERATIVE DIAGNOSES:  Chronic cholecystitis and cholelithiasis.   OPERATIVE PROCEDURE:  Laparoscopic cholecystectomy with intraoperative cholangiograms.   OPERATING SURGEON:  Hervey Ard, MD.   ANESTHESIA:  General endotracheal under Dr. Andree Elk.   ESTIMATED BLOOD LOSS:  Less than 5 mL.   CLINICAL NOTE:  This 44 year old woman has had episodic abdominal pain and ultrasound showed evidence of cholelithiasis.  She is brought to the operating room for planned cholecystectomy.   OPERATIVE NOTE:  With the patient under general endotracheal anesthesia, the abdomen was prepped with ChloraPrep and draped.  A Hassan technique was used to access the abdomen.  An  infraumbilical incision was made and carried down to the fascia.  The patient was found to have a small fascial defect at the umbilicus.  This was used for introduction of the [facility] cannula. Insufflation was completed with CO2 at 10 mmHg pressure.  There was no evidence of injury from initial port placement.  The patient was placed in the reverse Trendelenburg position and rolled to the left.  An 11 mm Xcel port was placed in the epigastrium and two 5 mm Step ports placed laterally.  There was noted to be significant adhesions of flimsy material from the omentum and the duodenum to the gallbladder.  This was taken down with cautery dissection. The neck of the gallbladder was cleared, and fluoroscopic cholangiograms were completed using one-half strength Conray 60, 8 mL volume.  This showed prompt filling of the right and left hepatic ducts and free flow into the duodenum.  The cystic duct and branches of the cystic artery were clipped adjacent to the gallbladder.  The gallbladder was removed from the liver bed making use of hook cautery dissection.  It was then  delivered through the umbilical port site without incident.  Several small stones were placed into a sterile container for the patient's review.   The right upper quadrant was irrigated with lactated Ringer's solution.  Good hemostasis was noted.  The abdomen was then desufflated and ports removed under direct vision.  The fascia at the umbilicus was closed with a single 0 Vicryl figure-of-eight suture.  Benzoin and Steri-Strips were applied after wound closure of the skin with a running 4-0 Vicryl subcuticular suture.   The patient tolerated the procedure well.      ____________________________ Nichole Bellow, MD [id number] D: 11/10/2014 12:00:16 ET T: 11/10/2014 18:05:26 ET JOB#: [id number]  cc: Nichole Bellow, MD, <Dictator> Jerilee Hoh, MD.   Dellis Filbert Amedeo Kinsman MD ELECTRONICALLY SIGNED 11/10/2014 21:59

## 2014-11-15 ENCOUNTER — Ambulatory Visit (INDEPENDENT_AMBULATORY_CARE_PROVIDER_SITE_OTHER): Payer: BLUE CROSS/BLUE SHIELD | Admitting: General Surgery

## 2014-11-15 ENCOUNTER — Encounter: Payer: Self-pay | Admitting: General Surgery

## 2014-11-15 VITALS — BP 120/70 | HR 80 | Resp 14 | Ht 64.0 in | Wt 167.0 lb

## 2014-11-15 DIAGNOSIS — K8020 Calculus of gallbladder without cholecystitis without obstruction: Secondary | ICD-10-CM

## 2014-11-15 NOTE — Progress Notes (Signed)
Patient ID: Nichole Campos, female   DOB: 1971/03/13, 44 y.o.   MRN: [id number]  Chief Complaint  Patient presents with  . Routine Post Op    gallbladder    HPI Nichole Campos is a 44 y.o. female here today for er post op gallbladder surgery done on 11/10/14. Patient states she is doing well. She required about 6 narcotic tablets after surgery. She has had somewhat delayed return of bowel function. Bowel movement yesterday. She is been maintaining a fairly soft diet area. She denies any postprandial pain. She reports resolution of the previously noted epigastric pain. HPI  Past Medical History  Diagnosis Date  . Migraines     as a teenager  . STD (sexually transmitted disease)     HSV1  . 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 Depression   . Family history of malignant neoplasm of breast   . Anxiety   . Arthritis     Past Surgical History  Procedure Laterality Date  . Arm surgery      age 35 rt arm surgery  . Tubal ligation      BTL with bands  . Breast fibroadenoma surgery Left     age 54  . Colonoscopy  07/27/2014    [facility]  . Breast biopsy Right Jan 2015  . Laparoscopy  1995? and 2002  . Appendectomy  1985?  Marland Kitchen Colonoscopy  2002    [facility]  . Cholecystectomy  11/10/14    Family History  Problem Relation Age of Onset  . Hypertension Mother   . Thyroid disease Mother   . Thyroid disease Sister   . Breast cancer Paternal Grandmother     81s; deceased    Social History History  Substance Use Topics  . Smoking status: Former Smoker    Types: Cigarettes    Start date: 07/15/1990    Quit date: 07/16/1991  . Smokeless tobacco: Never Used  . Alcohol Use: 0.0 - 0.6 oz/week    0-1 Standard drinks or equivalent per week     Comment: 1 Per month    Allergies  Allergen Reactions  . Aspirin     GI bleeding  . Latex     irritation    Current Outpatient Prescriptions  Medication Sig Dispense Refill  .  Acetaminophen (TYLENOL PO) Take by mouth as needed.    . ALPRAZolam (XANAX) 0.5 MG tablet Take 0.5 mg by mouth 2 (two) times daily as needed.     . cholecalciferol (VITAMIN D) 1000 UNITS tablet Take 1,000 Units by mouth daily.    Marland Kitchen desvenlafaxine (PRISTIQ) 50 MG 24 hr tablet Take 100 mg by mouth.     . gabapentin (NEURONTIN) 100 MG capsule Take 200 mg by mouth 2 (two) times daily. Patient takes 1 tablet by mouth twice daily as needed.    Marland Kitchen HYDROcodone-acetaminophen (NORCO/VICODIN) 5-325 MG per tablet   0  . levothyroxine (SYNTHROID, LEVOTHROID) 25 MCG tablet     . lisdexamfetamine (VYVANSE) 50 MG capsule     . loratadine (CLARITIN) 10 MG tablet Take 10 mg by mouth daily.    . Multiple Vitamins-Minerals (MULTIVITAMIN PO) Take by mouth daily.    . Naproxen Sodium (ALEVE PO) Take by mouth as needed.    Marland Kitchen VYVANSE 60 MG capsule Take 60 mg by mouth every morning.  0  . zolpidem (AMBIEN) 10 MG tablet Take 10 mg by mouth Nightly.     No  current facility-administered medications for this visit.    Review of Systems Review of Systems  Constitutional: Negative.   Respiratory: Negative.   Cardiovascular: Negative.     Blood pressure 120/70, pulse 80, resp. rate 14, height 5\' 4"  (1.626 m), weight 75.751 kg (167 lb), last menstrual period 10/10/2014.  Physical Exam Physical Exam  Constitutional: She is oriented to person, place, and time. She appears well-developed and well-nourished.  Cardiovascular: Normal rate, regular rhythm and normal heart sounds.   Pulmonary/Chest: Effort normal and breath sounds normal.  Abdominal: Soft. Normal appearance and bowel sounds are normal.  Port sites are clean and healing well.   Neurological: She is alert and oriented to person, place, and time.  Skin: Skin is warm and dry.    Data Reviewed Pathology showed chronic cholecystitis and cholelithiasis.  Assessment    Doing well status post cholecystectomy.    Plan    Patient to return as needed.  She  may increase her activity as tolerated. Proper lifting technique reviewed.     PCP:  Patrick North 11/16/2014, 6:54 AM

## 2014-11-15 NOTE — Patient Instructions (Addendum)
Patient to return as needed. Proper lifting techniques reviewed. 

## 2014-11-17 ENCOUNTER — Ambulatory Visit: Payer: BLUE CROSS/BLUE SHIELD | Admitting: General Surgery

## 2014-12-06 LAB — SURGICAL PATHOLOGY

## 2015-03-10 ENCOUNTER — Other Ambulatory Visit: Payer: Self-pay | Admitting: Nurse Practitioner

## 2015-03-10 DIAGNOSIS — R55 Syncope and collapse: Secondary | ICD-10-CM

## 2015-03-10 DIAGNOSIS — R479 Unspecified speech disturbances: Secondary | ICD-10-CM

## 2015-03-14 ENCOUNTER — Ambulatory Visit: Payer: Self-pay

## 2015-08-16 ENCOUNTER — Ambulatory Visit: Payer: Self-pay | Admitting: Certified Nurse Midwife

## 2015-10-04 ENCOUNTER — Ambulatory Visit: Payer: BLUE CROSS/BLUE SHIELD | Admitting: Certified Nurse Midwife

## 2015-11-15 ENCOUNTER — Ambulatory Visit: Payer: BLUE CROSS/BLUE SHIELD | Admitting: Certified Nurse Midwife

## 2015-11-29 ENCOUNTER — Ambulatory Visit: Payer: BLUE CROSS/BLUE SHIELD | Admitting: Certified Nurse Midwife

## 2015-12-27 ENCOUNTER — Ambulatory Visit: Payer: BLUE CROSS/BLUE SHIELD | Admitting: Certified Nurse Midwife

## 2016-01-10 ENCOUNTER — Ambulatory Visit: Payer: BLUE CROSS/BLUE SHIELD | Admitting: Certified Nurse Midwife

## 2016-02-07 ENCOUNTER — Ambulatory Visit (INDEPENDENT_AMBULATORY_CARE_PROVIDER_SITE_OTHER): Payer: BLUE CROSS/BLUE SHIELD | Admitting: Certified Nurse Midwife

## 2016-02-07 ENCOUNTER — Encounter: Payer: Self-pay | Admitting: Certified Nurse Midwife

## 2016-02-07 VITALS — BP 102/68 | HR 68 | Resp 16 | Ht 64.75 in | Wt 188.0 lb

## 2016-02-07 DIAGNOSIS — N920 Excessive and frequent menstruation with regular cycle: Secondary | ICD-10-CM | POA: Insufficient documentation

## 2016-02-07 DIAGNOSIS — N852 Hypertrophy of uterus: Secondary | ICD-10-CM

## 2016-02-07 DIAGNOSIS — Z0000 Encounter for general adult medical examination without abnormal findings: Secondary | ICD-10-CM

## 2016-02-07 DIAGNOSIS — N841 Polyp of cervix uteri: Secondary | ICD-10-CM

## 2016-02-07 DIAGNOSIS — Z01419 Encounter for gynecological examination (general) (routine) without abnormal findings: Secondary | ICD-10-CM

## 2016-02-07 DIAGNOSIS — Z124 Encounter for screening for malignant neoplasm of cervix: Secondary | ICD-10-CM | POA: Diagnosis not present

## 2016-02-07 LAB — POCT URINALYSIS DIPSTICK
BILIRUBIN UA: NEGATIVE
Blood, UA: NEGATIVE
Glucose, UA: NEGATIVE
Ketones, UA: NEGATIVE
LEUKOCYTES UA: NEGATIVE
Nitrite, UA: NEGATIVE
PROTEIN UA: NEGATIVE
Urobilinogen, UA: NEGATIVE
pH, UA: 5

## 2016-02-07 LAB — HEMOGLOBIN, FINGERSTICK: Hemoglobin, fingerstick: 12.1 g/dL (ref 12.0–16.0)

## 2016-02-07 NOTE — Progress Notes (Signed)
45 y.o. G32P1001 Married  Caucasian Fe here for annual exam. Gallbladder surgery last year, eating still adjusting to change. Periods normal, but heavy and duration of 8 days. Some cramping uses OTC medication with good relief. Really would like help with heavy period. Not interested in contraceptive options, due headaches with hormone use and not interested in IUD. Sees PA for hypothyroid management all stable. Sees Psychiatrist for medication management of anxiety/depression. No health issues today. Desires screening labs. Planning some time to herself soon!  LMP: 01/19/16          Sexually active: Yes.    The current method of family planning is tubal ligation.    Exercising: Yes.    yoga Smoker:  no  Health Maintenance: Pap:  08-10-14 neg HPV HR neg MMG:  06-14-15 left breast category d density birads 2:neg, bilateral prob due Colonoscopy:  2016 inflammatory polyp f/u in 15yrs BMD:   none TDaP:  2008 Shingles: no Pneumonia: no Hep C and HIV: HIV neg yrs ago Labs: poct urine-neg, hgb-12.1 Self breast exam: done monthly   reports that she quit smoking about 24 years ago. Her smoking use included Cigarettes. She started smoking about 25 years ago. She has never used smokeless tobacco. She reports that she drinks alcohol. She reports that she does not use drugs.  Past Medical History:  Diagnosis Date  . Anxiety   . Arthritis   . Depression   . Family history of malignant neoplasm of breast   . Migraines    as a teenager  . 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 STD (sexually transmitted disease)    HSV1    Past Surgical History:  Procedure Laterality Date  . APPENDECTOMY  1985?  Marland Kitchen arm surgery     age 29 rt arm surgery  . BREAST BIOPSY Right Jan 2015  . BREAST FIBROADENOMA SURGERY Left    age 73  . CHOLECYSTECTOMY  11/10/14  . COLONOSCOPY  07/27/2014   [facility]  . COLONOSCOPY  2002   [facility]  . LAPAROSCOPY  1995? and 2002   . TUBAL LIGATION     BTL with bands    Current Outpatient Prescriptions  Medication Sig Dispense Refill  . Acetaminophen (TYLENOL PO) Take by mouth as needed.    . ALPRAZolam (XANAX) 0.5 MG tablet Take 0.5 mg by mouth 2 (two) times daily as needed.     . cholecalciferol (VITAMIN D) 1000 UNITS tablet Take 1,000 Units by mouth daily.    Marland Kitchen desvenlafaxine (PRISTIQ) 50 MG 24 hr tablet Take 100 mg by mouth.     . gabapentin (NEURONTIN) 100 MG capsule Take 200 mg by mouth 2 (two) times daily. Patient takes 1 tablet by mouth twice daily as needed.    Marland Kitchen HYDROcodone-acetaminophen (NORCO/VICODIN) 5-325 MG per tablet   0  . levothyroxine (SYNTHROID, LEVOTHROID) 25 MCG tablet     . lisdexamfetamine (VYVANSE) 50 MG capsule     . loratadine (CLARITIN) 10 MG tablet Take 10 mg by mouth daily.    . Multiple Vitamins-Minerals (MULTIVITAMIN PO) Take by mouth daily.    . Naproxen Sodium (ALEVE PO) Take by mouth as needed.    Marland Kitchen VYVANSE 60 MG capsule Take 60 mg by mouth every morning.  0  . zolpidem (AMBIEN) 10 MG tablet Take 10 mg by mouth Nightly.     No current facility-administered medications for this visit.     Family History  Problem Relation Age of Onset  . Hypertension Mother   . Thyroid disease Mother   . Thyroid disease Sister   . Breast cancer Paternal Grandmother     74s; deceased    ROS:  Pertinent items are noted in HPI.  Otherwise, a comprehensive ROS was negative.  Exam:   There were no vitals taken for this visit.   Ht Readings from Last 3 Encounters:  11/15/14 5\' 4"  (1.626 m)  10/19/14 5\' 4"  (1.626 m)  08/10/14 5' 4.75" (1.645 m)    General appearance: alert, cooperative and appears stated age Head: Normocephalic, without obvious abnormality, atraumatic Neck: no adenopathy, supple, symmetrical, trachea midline and thyroid enlarged( known) no change Lungs: clear to auscultation bilaterally Breasts: normal appearance, no masses or tenderness, No nipple retraction or  dimpling, No nipple discharge or bleeding, No axillary or supraclavicular adenopathy Heart: regular rate and rhythm Abdomen: soft, non-tender; no masses,  no organomegaly, surgical scars from gallbladder surgery  Extremities: extremities normal, atraumatic, no cyanosis or edema Skin: Skin color, texture, turgor normal. No rashes or lesions Lymph nodes: Cervical, supraclavicular, and axillary nodes normal. No abnormal inguinal nodes palpated Neurologic: Grossly normal   Pelvic: External genitalia:  no lesions              Urethra:  normal appearing urethra with no masses, tenderness or lesions              Bartholin's and Skene's: normal                 Vagina: normal appearing vagina with normal color and discharge, no lesions              Cervix: multiparous appearance, no bleeding following Pap, no cervical motion tenderness and tiny polyps noted in cervical os(2) no bleeding when touched with pap brush              Pap taken: Yes.   Bimanual Exam:  Uterus:  enlarged, 8- weeks size tilts to left, non tender weeks size              Adnexa: normal adnexa and no mass, fullness, tenderness               Rectovaginal: Confirms               Anus:  normal sphincter tone, no lesions  Chaperone present: yes  A:  Well Woman with normal exam  Contraception tubal  Menorrhagia with 8 day cycles  Enlarged uterus  Cervical polyps  Hypothyroid/ anxiety/depression with MD management  Recent gallbladder surgery in last year  Mammogram due  Screening labs  P:   Reviewed health and wellness pertinent to exam  Discussed Menorrhagia and ablation possibility to help with heavy bleeding and fatigue with period. Discussed risks/benefits and given information handout. Discussed would need [facility] and possible endometrial biopsy prior. Questions addressed, patient will consider and advise.  Discussed enlarged uterine finding and possible etiology of fibroids, adenomyosis or mass. Would need PUS to  evaluate. Patient agreeable. Patient will be called with insurance info and scheduled. Questions addressed.  Discussed cervical polyp finding and benign finding and do not need to be removed unless become symptomatic or change. Warning signs with spotting or bleeding with intercourse, she should advise. Questions addressed.  Continue follow up as indicated  Stressed mammogram and will schedule  Labs: TSH with panel, Vitamin D, Lipid panel, Hep C, HIV  Pap smear as above  counseled on breast self  exam, mammography screening, adequate intake of calcium and vitamin D, diet and exercise  return annually or prn  An After Visit Summary was printed and given to the patient.

## 2016-02-07 NOTE — Patient Instructions (Signed)

## 2016-02-08 LAB — THYROID PANEL WITH TSH
Free Thyroxine Index: 2.1 (ref 1.4–3.8)
T3 UPTAKE: 29 % (ref 22–35)
T4 TOTAL: 7.4 ug/dL (ref 4.5–12.0)
TSH: 0.36 m[IU]/L — AB

## 2016-02-08 LAB — HEPATITIS C ANTIBODY: HCV Ab: NEGATIVE

## 2016-02-08 LAB — LIPID PANEL
Cholesterol: 166 mg/dL (ref 125–200)
HDL: 46 mg/dL (ref >=46)
LDL CALC: 90 mg/dL (ref <130)
Total CHOL/HDL Ratio: 3.6 Ratio (ref <=5.0)
Triglycerides: 148 mg/dL (ref <150)
VLDL: 30 mg/dL (ref <30)

## 2016-02-08 LAB — HIV ANTIBODY (ROUTINE TESTING W REFLEX): HIV 1&2 Ab, 4th Generation: NONREACTIVE

## 2016-02-08 LAB — VITAMIN D 25 HYDROXY (VIT D DEFICIENCY, FRACTURES): VIT D 25 HYDROXY: 40 ng/mL (ref 30–100)

## 2016-02-08 NOTE — Progress Notes (Signed)
Encounter reviewed Jill Jertson, MD   

## 2016-02-09 LAB — IPS PAP TEST WITH HPV

## 2016-02-13 ENCOUNTER — Telehealth: Payer: Self-pay | Admitting: Certified Nurse Midwife

## 2016-02-13 NOTE — Telephone Encounter (Signed)
Called patient to review benefits for a recommended procedure. Left Voicemail requesting a call back. °

## 2016-02-14 ENCOUNTER — Other Ambulatory Visit: Payer: Self-pay | Admitting: Obstetrics and Gynecology

## 2016-02-14 DIAGNOSIS — N920 Excessive and frequent menstruation with regular cycle: Secondary | ICD-10-CM

## 2016-02-14 DIAGNOSIS — N852 Hypertrophy of uterus: Secondary | ICD-10-CM

## 2016-02-21 ENCOUNTER — Telehealth: Payer: Self-pay | Admitting: Obstetrics and Gynecology

## 2016-02-21 NOTE — Telephone Encounter (Signed)
Patient cancelled her ultrasound appointment for Tuesday because of her work schedule and would like a call back to reschedule. Patient would prefer an appointment on 03/14/16 in the afternoon.

## 2016-02-22 NOTE — Telephone Encounter (Signed)
Patient wants to reschedule her ultrasound. She would like to come on 03/28/16 after 2pm.

## 2016-02-22 NOTE — Telephone Encounter (Signed)
Spoke with patient. Patient would like to rescheduled her ultrasound for 03/14/2016. PUS rescheduled to 8/31/22017 at 2:30 pm with 3 pm consult with Dr.Jertson. She is agreeable to date and time.  Routing to provider for final review. Patient agreeable to disposition. Will close encounter.

## 2016-02-22 NOTE — Telephone Encounter (Signed)
Left message to call Kaitlyn at [phone]. 

## 2016-02-23 NOTE — Telephone Encounter (Signed)
Returning a call to [facility] to reschedule her ultrasound.  Nichole Campos is out of the office today.

## 2016-02-23 NOTE — Telephone Encounter (Signed)
Returned patients call to reschedule. Left voicemail.

## 2016-02-26 NOTE — Telephone Encounter (Signed)
Spoke with patient. 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 Appointment rescheduled to 03/28/2016 at 2:30 pm with 3 pm consult with Dr.Jertson. She is agreeable to date and time.  Routing to provider for final review. Patient agreeable to disposition. Will close encounter.

## 2016-02-26 NOTE — Telephone Encounter (Signed)
Left message to call Shantia Sanford at [phone]. 

## 2016-02-26 NOTE — Telephone Encounter (Signed)
Patient is returning a call to Kaitlyn. °

## 2016-02-27 ENCOUNTER — Other Ambulatory Visit: Payer: BLUE CROSS/BLUE SHIELD | Admitting: Obstetrics and Gynecology

## 2016-02-27 ENCOUNTER — Other Ambulatory Visit: Payer: BLUE CROSS/BLUE SHIELD

## 2016-03-14 ENCOUNTER — Other Ambulatory Visit: Payer: BLUE CROSS/BLUE SHIELD | Admitting: Obstetrics and Gynecology

## 2016-03-14 ENCOUNTER — Other Ambulatory Visit: Payer: BLUE CROSS/BLUE SHIELD

## 2016-03-15 ENCOUNTER — Ambulatory Visit (INDEPENDENT_AMBULATORY_CARE_PROVIDER_SITE_OTHER): Payer: BLUE CROSS/BLUE SHIELD | Admitting: Licensed Clinical Social Worker

## 2016-03-15 ENCOUNTER — Encounter: Payer: Self-pay | Admitting: Licensed Clinical Social Worker

## 2016-03-15 DIAGNOSIS — F411 Generalized anxiety disorder: Secondary | ICD-10-CM | POA: Diagnosis not present

## 2016-03-15 DIAGNOSIS — F330 Major depressive disorder, recurrent, mild: Secondary | ICD-10-CM | POA: Diagnosis not present
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
## 2016-03-28 ENCOUNTER — Encounter: Payer: Self-pay | Admitting: Obstetrics and Gynecology

## 2016-03-28 ENCOUNTER — Ambulatory Visit (INDEPENDENT_AMBULATORY_CARE_PROVIDER_SITE_OTHER): Payer: BLUE CROSS/BLUE SHIELD

## 2016-03-28 ENCOUNTER — Ambulatory Visit (INDEPENDENT_AMBULATORY_CARE_PROVIDER_SITE_OTHER): Payer: BLUE CROSS/BLUE SHIELD | Admitting: Obstetrics and Gynecology

## 2016-03-28 ENCOUNTER — Other Ambulatory Visit: Payer: Self-pay | Admitting: Obstetrics and Gynecology

## 2016-03-28 VITALS — BP 112/68 | HR 76 | Resp 13 | Wt 185.0 lb

## 2016-03-28 DIAGNOSIS — N946 Dysmenorrhea, unspecified: Secondary | ICD-10-CM | POA: Diagnosis not present

## 2016-03-28 DIAGNOSIS — G43109 Migraine with aura, not intractable, without status migrainosus: Secondary | ICD-10-CM | POA: Insufficient documentation

## 2016-03-28 DIAGNOSIS — N920 Excessive and frequent menstruation with regular cycle: Secondary | ICD-10-CM

## 2016-03-28 DIAGNOSIS — N852 Hypertrophy of uterus: Secondary | ICD-10-CM

## 2016-03-28 MED ORDER — NORETHINDRONE 0.35 MG PO TABS: 1.0000 | ORAL_TABLET | Freq: Every day | ORAL | 0 refills | Status: DC

## 2016-03-28 NOTE — Progress Notes (Signed)
GYNECOLOGY  VISIT   HPI: 45 y.o.   Married  Caucasian  female   G2P1011 with Patient's last menstrual period was 03/18/2016.   here for follow up heavy menstrual bleeding. Sonohysterogram/  EMB.  She was sent for evaluation by Evalee Mutton, CNM for menorrhagia. Not anemic, slightly low TSH (on synthroid, supposed to be suppressed). Can't be on OCP's secondary to migraines with aura,  not interested in IUD's. Tubal ligation for contraception. Menses q month x 7-8 days. Saturates a pad in 1.5-2 hours. No BTB. Cramps are significant, takes aleve and tylenol, which helps.  Sexually active, no pain.  She is a Orthoptist NP The patient's husband, Eddie Dibbles, is present with her throughout the visit.   GYNECOLOGIC HISTORY: Patient's last menstrual period was 03/18/2016. Contraception: tubal ligation Menopausal hormone therapy: none         OB History    Gravida Para Term Preterm AB Living   2 1 1   1 1    SAB TAB Ectopic Multiple Live Births   1              Obstetric Comments   1st Menstrual Cycle:  13 1st Pregnancy:  23         Patient Active Problem List   Diagnosis Date Noted  . Migraine with aura   . Generalized anxiety disorder 03/15/2016  . Mild episode of recurrent major depressive disorder ([place]) 03/15/2016  . Menorrhagia with regular cycle 02/07/2016    Class: History of  . Gall stones 10/19/2014  . Family history of malignant neoplasm of breast     Past Medical History:  Diagnosis Date  . Anxiety   . Arthritis   . Depression   . Family history of malignant neoplasm of breast   . Migraines    as a teenager  . 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 STD (sexually transmitted disease)    HSV1    Past Surgical History:  Procedure Laterality Date  . APPENDECTOMY  1985?  Marland Kitchen arm surgery     age 89 rt arm surgery  . BREAST BIOPSY Right Jan 2015  . BREAST FIBROADENOMA SURGERY Left    age 67  . CHOLECYSTECTOMY  11/10/14  .  COLONOSCOPY  07/27/2014   [facility]  . COLONOSCOPY  2002   [facility]  . LAPAROSCOPY  1995? and 2002  . TUBAL LIGATION     BTL with bands    Current Outpatient Prescriptions  Medication Sig Dispense Refill  . Acetaminophen (TYLENOL PO) Take by mouth as needed.    . ALPRAZolam (XANAX) 0.5 MG tablet Take 0.5 mg by mouth 2 (two) times daily as needed.     Marland Kitchen amphetamine-dextroamphetamine (ADDERALL) 30 MG tablet 3 times/day as needed-between meals & bedtime.  0  . cholecalciferol (VITAMIN D) 1000 UNITS tablet Take 1,000 Units by mouth daily.    Marland Kitchen desvenlafaxine (PRISTIQ) 50 MG 24 hr tablet Take 100 mg by mouth.     . gabapentin (NEURONTIN) 100 MG capsule Take 200 mg by mouth 2 (two) times daily. Patient takes 1 tablet by mouth twice daily as needed.    Marland Kitchen levothyroxine (SYNTHROID, LEVOTHROID) 25 MCG tablet     . loratadine (CLARITIN) 10 MG tablet Take 10 mg by mouth daily.    . Multiple Vitamins-Minerals (MULTIVITAMIN PO) Take by mouth daily.    . Naproxen Sodium (ALEVE PO) Take by mouth as needed.    Marland Kitchen  VYVANSE 60 MG capsule Take 60 mg by mouth every morning.  0  . zolpidem (AMBIEN) 10 MG tablet Take 10 mg by mouth Nightly.     No current facility-administered medications for this visit.      ALLERGIES: Aspirin and Latex  Family History  Problem Relation Age of Onset  . Hypertension Mother   . Thyroid disease Mother   . Thyroid disease Sister   . Breast cancer Paternal Grandmother     53s; deceased    Social History   Social History  . Marital status: Married    Spouse name: N/A  . Number of children: 1  . Years of education: N/A   Occupational History  .  [place]   Social History Main Topics  . Smoking status: Former Smoker    Types: Cigarettes    Start date: 07/15/1990    Quit date: 07/16/1991  . Smokeless tobacco: Never Used     Comment: age 52 for about 46yr  . Alcohol use 0.0 - 0.6 oz/week     Comment: 1 Per month  . Drug use: No  . Sexual  activity: Yes    Partners: Male    Birth control/ protection: Surgical     Comment: BTL   Other Topics Concern  . Not on file   Social History Narrative  . No narrative on file    Review of Systems  Constitutional: Negative.   HENT: Negative.   Eyes: Negative.   Respiratory: Negative.   Cardiovascular: Negative.   Gastrointestinal: Negative.   Genitourinary:       Heavy menstrual bleeding   Musculoskeletal: Negative.   Skin: Negative.   Neurological: Negative.   Endo/Heme/Allergies: Negative.   Psychiatric/Behavioral: Negative.     PHYSICAL EXAMINATION:    BP 112/68 (BP Location: Right Arm, Patient Position: Sitting, Cuff Size: Normal)   Pulse 76   Resp 13   Wt 185 lb (83.9 kg)   LMP 03/18/2016   BMI 31.02 kg/m     General appearance: alert, cooperative and appears stated age  Pelvic: External genitalia:  no lesions              Urethra:  normal appearing urethra with no masses, tenderness or lesions              Bartholins and Skenes: normal                 Vagina: normal appearing vagina with normal color and discharge, no lesions              Cervix: no lesions   Sonohysterogram The procedure and risks of the procedure were reviewed with the patient, consent form was signed. A speculum was placed in the vagina and the cervix was cleansed with betadine. The sonohysterogram catheter was inserted into the uterine cavity without difficulty. Saline was infused under direct observation with the ultrasound. There is a small fibroid (under 1cm) that may deviate the cavity slightly. The catheter was removed.     The risks of endometrial biopsy were reviewed and a consent was obtained.  A speculum was placed in the vagina and the cervix was cleansed with betadine The mini-pipelle was placed into the endometrial cavity. The uterus sounded to 8 cm. The endometrial biopsy was performed, 3 passes were made, first 2 with just fluid, last with some tissue. The speculum was  removed. There were no complications.  Chaperone was present for exam.  ASSESSMENT Menorrhagia, sonohysterogram with a very small myoma that slightly deviates the cavity Dysmeorrhea    PLAN Not a candidate for OCP's secondary to migraines with aura Discussed options of micronor, depo-provera, mirena IUD and endometrial ablation (would do with hysteroscopy, possible myomectomy and D&C) and hysterectomy as a last option. Discussed risks and benefits of different options.  She would like a trial of micronor F/U in 3 months to see how she is doing on the micronor and to answer any questions about her options   An After Visit Summary was printed and given to the patient.  Over 25 minutes face to face time of which over 50% was spent in counseling.

## 2016-08-12 ENCOUNTER — Other Ambulatory Visit: Payer: Self-pay | Admitting: Internal Medicine

## 2016-08-12 DIAGNOSIS — Z1231 Encounter for screening mammogram for malignant neoplasm of breast: Secondary | ICD-10-CM

## 2016-09-13 ENCOUNTER — Ambulatory Visit
Admission: RE | Admit: 2016-09-13 | Discharge: 2016-09-13 | Disposition: A | Discharge status: Home / Self Care | Payer: Self-pay | Source: Ambulatory Visit | Attending: Internal Medicine | Admitting: Internal Medicine

## 2016-09-13 DIAGNOSIS — Z1231 Encounter for screening mammogram for malignant neoplasm of breast: Secondary | ICD-10-CM | POA: Insufficient documentation

## 2016-09-20 ENCOUNTER — Inpatient Hospital Stay
Admission: RE | Admit: 2016-09-20 | Discharge: 2016-09-20 | Disposition: A | Discharge status: Home / Self Care | Payer: Self-pay | Source: Ambulatory Visit | Attending: *Deleted | Admitting: *Deleted

## 2016-09-20 ENCOUNTER — Other Ambulatory Visit: Payer: Self-pay | Admitting: *Deleted

## 2016-09-20 DIAGNOSIS — Z1231 Encounter for screening mammogram for malignant neoplasm of breast: Secondary | ICD-10-CM

## 2016-10-29 ENCOUNTER — Encounter: Payer: Self-pay | Admitting: General Surgery

## 2016-10-29 ENCOUNTER — Ambulatory Visit (INDEPENDENT_AMBULATORY_CARE_PROVIDER_SITE_OTHER): Payer: 59 | Admitting: General Surgery

## 2016-10-29 VITALS — BP 124/72 | HR 76 | Resp 12 | Ht 65.0 in | Wt 194.0 lb

## 2016-10-29 DIAGNOSIS — D1800 Hemangioma unspecified site: Secondary | ICD-10-CM | POA: Diagnosis not present

## 2016-10-29 NOTE — Progress Notes (Signed)
Patient ID: Nichole Campos, female   DOB: 1970-09-19, 46 y.o.   MRN: [id number]  Chief Complaint  Patient presents with  . Mass    HPI Nichole Campos is a 46 y.o. female here today for a evaluation of a right thigh mass. Patient states the area has been for about three years.She noticed the area getting bigger over the last year. The area is starting to achy while standing. She had a hemangioma her right upper legs removed 20 years ago.  HPI  Past Medical History:  Diagnosis Date  . ADD (attention deficit disorder)   . Anxiety   . Arthritis   . Depression   . Family history of malignant neoplasm of breast   . Hashimoto's disease   . Migraine with aura    as a teenager  . 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 STD (sexually transmitted disease)    HSV1    Past Surgical History:  Procedure Laterality Date  . APPENDECTOMY  1985?  Marland Kitchen arm surgery     age 61 rt arm surgery  . BREAST BIOPSY Right Jan 2015  . BREAST BIOPSY Left 09/2014  . BREAST FIBROADENOMA SURGERY Left    age 25  . CHOLECYSTECTOMY  11/10/14  . COLONOSCOPY  07/27/2014   [facility]  . COLONOSCOPY  2002   [facility]  . LAPAROSCOPY  1995? and 2002  . TUBAL LIGATION     BTL with bands    Family History  Problem Relation Age of Onset  . Hypertension Mother   . Thyroid disease Mother   . Thyroid disease Sister   . Breast cancer Paternal Grandmother     6s; deceased    Social History Social History  Substance Use Topics  . Smoking status: Former Smoker    Types: Cigarettes    Start date: 07/15/1990    Quit date: 07/16/1991  . Smokeless tobacco: Never Used     Comment: age 70 for about 40yr  . Alcohol use 0.0 - 0.6 oz/week     Comment: 1 Per month    Allergies  Allergen Reactions  . Aspirin     GI bleeding  . Latex     irritation    Current Outpatient Prescriptions  Medication Sig Dispense Refill  . Acetaminophen (TYLENOL PO) Take by mouth as needed.     . ALPRAZolam (XANAX) 0.5 MG tablet Take 0.5 mg by mouth 2 (two) times daily as needed.     Marland Kitchen amphetamine-dextroamphetamine (ADDERALL) 30 MG tablet 3 times/day as needed-between meals & bedtime.  0  . cholecalciferol (VITAMIN D) 1000 UNITS tablet Take 1,000 Units by mouth daily.    Marland Kitchen desvenlafaxine (PRISTIQ) 100 MG 24 hr tablet     . gabapentin (NEURONTIN) 100 MG capsule Take 200 mg by mouth 2 (two) times daily. Patient takes 1 tablet by mouth twice daily as needed.    Marland Kitchen levothyroxine (SYNTHROID, LEVOTHROID) 25 MCG tablet     . loratadine (CLARITIN) 10 MG tablet Take 10 mg by mouth daily.    . Multiple Vitamins-Minerals (MULTIVITAMIN PO) Take by mouth daily.    . Naproxen Sodium (ALEVE PO) Take by mouth as needed.    . norethindrone (MICRONOR,CAMILA,ERRIN) 0.35 MG tablet Take 1 tablet (0.35 mg total) by mouth daily. 3 Package 0  . VYVANSE 60 MG capsule Take 60 mg by mouth every morning.  0  . zolpidem (AMBIEN) 10 MG tablet Take 10 mg by mouth  Nightly.     No current facility-administered medications for this visit.     Review of Systems Review of Systems  Blood pressure 124/72, pulse 76, resp. rate 12, height 5\' 5"  (1.651 m), weight 194 lb (88 kg), last menstrual period 10/23/2016.  Physical Exam Physical Exam  Constitutional: She is oriented to person, place, and time. She appears well-developed and well-nourished.  Neurological: She is alert and oriented to person, place, and time.  Skin: Skin is warm and dry.         Assessment    Enlarging superficial hemangioma of the right anterior thigh.    Plan    Excision under local anesthesia will be deferred until tomorrow due to the late hour.  Typically this would've been treated during this visit and therefore no co-pay is required tomorrow.    Patient to return for excision right leg hemangioma on 10/30/2016 at 4:00pm  HPI, Physical Exam, Assessment and Plan have been scribed under the direction and in the presence of  Hervey Ard, MD.  [name], CMA I have completed the exam and reviewed the above documentation for accuracy and completeness.  I agree with the above.  Haematologist has been used and any errors in dictation or transcription are unintentional.  Hervey Ard, M.D., F.A.C.S.  Nichole Campos 10/30/2016, 6:46 AM

## 2016-10-29 NOTE — Patient Instructions (Signed)
Patient to return for excision right leg hemangioma on 10/30/2016 at 4:00pm

## 2016-10-30 ENCOUNTER — Ambulatory Visit: Payer: 59 | Admitting: General Surgery

## 2016-10-30 DIAGNOSIS — D1800 Hemangioma unspecified site: Secondary | ICD-10-CM | POA: Insufficient documentation

## 2017-02-12 ENCOUNTER — Ambulatory Visit: Payer: BLUE CROSS/BLUE SHIELD | Admitting: Certified Nurse Midwife

## 2017-04-02 ENCOUNTER — Ambulatory Visit: Payer: 59 | Admitting: Certified Nurse Midwife

## 2017-05-14 ENCOUNTER — Ambulatory Visit: Payer: 59 | Admitting: Certified Nurse Midwife

## 2017-06-25 ENCOUNTER — Other Ambulatory Visit: Payer: Self-pay | Admitting: Internal Medicine

## 2017-06-26 ENCOUNTER — Other Ambulatory Visit: Payer: Self-pay

## 2017-06-26 MED ORDER — LEVOTHYROXINE SODIUM 25 MCG PO TABS: 25.0000 ug | ORAL_TABLET | Freq: Every day | ORAL | 3 refills | Status: DC

## 2017-08-04 ENCOUNTER — Other Ambulatory Visit: Payer: Self-pay

## 2017-08-04 MED ORDER — DESVENLAFAXINE SUCCINATE ER 100 MG PO TB24: 100.0000 mg | ORAL_TABLET | Freq: Every day | ORAL | 2 refills | Status: AC

## 2017-08-20 ENCOUNTER — Ambulatory Visit: Payer: 59 | Admitting: Certified Nurse Midwife

## 2017-10-01 ENCOUNTER — Ambulatory Visit: Payer: 59 | Admitting: Certified Nurse Midwife

## 2017-10-01 ENCOUNTER — Other Ambulatory Visit: Payer: Self-pay | Admitting: Internal Medicine

## 2017-10-06 ENCOUNTER — Other Ambulatory Visit: Payer: Self-pay

## 2017-10-06 MED ORDER — GABAPENTIN 100 MG PO CAPS: 200.0000 mg | ORAL_CAPSULE | Freq: Two times a day (BID) | ORAL | 3 refills | Status: DC

## 2017-10-30 ENCOUNTER — Other Ambulatory Visit: Payer: Self-pay | Admitting: Internal Medicine

## 2017-11-11 ENCOUNTER — Other Ambulatory Visit: Payer: Self-pay

## 2017-11-11 MED ORDER — AZITHROMYCIN 250 MG PO TABS: ORAL_TABLET | ORAL | 0 refills | Status: DC

## 2017-11-11 MED ORDER — METHYLPREDNISOLONE 4 MG PO TBPK: ORAL_TABLET | ORAL | 0 refills | Status: DC

## 2017-11-19 ENCOUNTER — Ambulatory Visit: Payer: 59 | Admitting: Certified Nurse Midwife

## 2017-12-02 ENCOUNTER — Other Ambulatory Visit: Payer: Self-pay | Admitting: Internal Medicine

## 2017-12-02 DIAGNOSIS — Z1239 Encounter for other screening for malignant neoplasm of breast: Secondary | ICD-10-CM

## 2017-12-17 ENCOUNTER — Ambulatory Visit
Admission: RE | Admit: 2017-12-17 | Discharge: 2017-12-17 | Disposition: A | Discharge status: Home / Self Care | Payer: 59 | Source: Ambulatory Visit | Attending: Internal Medicine | Admitting: Internal Medicine

## 2017-12-17 DIAGNOSIS — Z1231 Encounter for screening mammogram for malignant neoplasm of breast: Secondary | ICD-10-CM | POA: Insufficient documentation

## 2017-12-17 DIAGNOSIS — Z1239 Encounter for other screening for malignant neoplasm of breast: Secondary | ICD-10-CM

## 2017-12-29 ENCOUNTER — Other Ambulatory Visit: Payer: Self-pay | Admitting: Internal Medicine

## 2018-01-09 ENCOUNTER — Other Ambulatory Visit: Payer: Self-pay | Admitting: Internal Medicine

## 2018-01-28 ENCOUNTER — Ambulatory Visit: Payer: 59 | Admitting: Certified Nurse Midwife

## 2018-02-12 ENCOUNTER — Other Ambulatory Visit: Payer: Self-pay | Admitting: Internal Medicine

## 2018-02-25 ENCOUNTER — Other Ambulatory Visit: Payer: Self-pay | Admitting: Adult Health

## 2018-03-04 LAB — BASIC METABOLIC PANEL
BUN / CREAT RATIO: 16 (ref 9–23)
BUN: 12 mg/dL (ref 6–24)
CHLORIDE: 103 mmol/L (ref 96–106)
CO2: 23 mmol/L (ref 20–29)
Calcium: 9.6 mg/dL (ref 8.7–10.2)
Creatinine, Ser: 0.76 mg/dL (ref 0.57–1.00)
GFR, EST AFRICAN AMERICAN: 110 mL/min/{1.73_m2} (ref >59)
GFR, EST NON AFRICAN AMERICAN: 96 mL/min/{1.73_m2} (ref >59)
Glucose: 97 mg/dL (ref 65–99)
POTASSIUM: 4.3 mmol/L (ref 3.5–5.2)
SODIUM: 139 mmol/L (ref 134–144)

## 2018-03-04 LAB — CBC
HEMATOCRIT: 37.4 % (ref 34.0–46.6)
Hemoglobin: 12.6 g/dL (ref 11.1–15.9)
MCH: 30.3 pg (ref 26.6–33.0)
MCHC: 33.7 g/dL (ref 31.5–35.7)
MCV: 90 fL (ref 79–97)
PLATELETS: 290 10*3/uL (ref 150–450)
RBC: 4.16 x10E6/uL (ref 3.77–5.28)
RDW: 15.1 % (ref 12.3–15.4)
WBC: 4.4 10*3/uL (ref 3.4–10.8)

## 2018-03-04 LAB — QUANTIFERON-TB GOLD PLUS
QuantiFERON Nil Value: 0.05 IU/mL
QuantiFERON TB1 Ag Value: 0.12 IU/mL
QuantiFERON TB2 Ag Value: 0.07 IU/mL
QuantiFERON-TB Gold Plus: NEGATIVE

## 2018-03-04 LAB — TSH: TSH: 0.732 u[IU]/mL (ref 0.450–4.500)

## 2018-03-04 LAB — MEASLES/MUMPS/RUBELLA IMMUNITY
MUMPS ABS, IGG: 172 [AU]/ml (ref >10.9)
RUBEOLA AB, IGG: >300 AU/mL (ref >29.9)
Rubella Antibodies, IGG: 15.8 index (ref >0.99)

## 2018-03-04 LAB — T4, FREE: FREE T4: 1.05 ng/dL (ref 0.82–1.77)

## 2018-03-04 LAB — VARICELLA ZOSTER ANTIBODY, IGG: VARICELLA: 1364 {index} (ref >165)

## 2018-03-04 LAB — HEPATITIS B SURFACE ANTIGEN: Hepatitis B Surface Ag: NEGATIVE

## 2018-03-10 ENCOUNTER — Other Ambulatory Visit: Payer: Self-pay | Admitting: Internal Medicine

## 2018-04-01 ENCOUNTER — Other Ambulatory Visit: Payer: Self-pay

## 2018-04-01 ENCOUNTER — Ambulatory Visit (INDEPENDENT_AMBULATORY_CARE_PROVIDER_SITE_OTHER): Payer: 59 | Admitting: Certified Nurse Midwife

## 2018-04-01 ENCOUNTER — Other Ambulatory Visit (HOSPITAL_COMMUNITY)
Admission: RE | Admit: 2018-04-01 | Discharge: 2018-04-01 | Disposition: A | Discharge status: Home / Self Care | Payer: 59 | Source: Ambulatory Visit | Attending: Certified Nurse Midwife | Admitting: Certified Nurse Midwife

## 2018-04-01 ENCOUNTER — Encounter: Payer: Self-pay | Admitting: Certified Nurse Midwife

## 2018-04-01 VITALS — BP 120/78 | HR 80 | Resp 16 | Ht 64.5 in | Wt 188.0 lb

## 2018-04-01 DIAGNOSIS — Z01411 Encounter for gynecological examination (general) (routine) with abnormal findings: Secondary | ICD-10-CM | POA: Diagnosis not present

## 2018-04-01 DIAGNOSIS — Z23 Encounter for immunization: Secondary | ICD-10-CM

## 2018-04-01 DIAGNOSIS — N920 Excessive and frequent menstruation with regular cycle: Secondary | ICD-10-CM | POA: Diagnosis not present

## 2018-04-01 DIAGNOSIS — Z124 Encounter for screening for malignant neoplasm of cervix: Secondary | ICD-10-CM | POA: Insufficient documentation

## 2018-04-01 DIAGNOSIS — N841 Polyp of cervix uteri: Secondary | ICD-10-CM

## 2018-04-01 NOTE — Patient Instructions (Signed)

## 2018-04-01 NOTE — Progress Notes (Addendum)
47 y.o. G63P1011 Married  Caucasian Fe here for annual exam. Periods heavy first 2 days, but last 8-10 days.Tired of this type of cycle. Has tried Micronor with fair success. Considering ablation again. Planned for this two years ago and decided it was not a good time for her.  No weight change in past two years. Still not smoking after 07/16/91. Sees Dr. Humphrey Rolls for aex, labs, medication management. Sees Psychiatrist Dr. Marella Bile for medication management of anxiety, all stable per patient. No other health concerns today. Working on [employer] now!  LMP: 03/24/18          Sexually active: Yes.    The current method of family planning is tubal ligation.    Exercising: Yes.    yoga & walking Smoker:  no  Review of Systems  Constitutional: Negative.   HENT: Negative.   Eyes: Negative.   Respiratory: Negative.   Cardiovascular: Negative.   Gastrointestinal: Negative.   Genitourinary:       Painful cycles, menstrual cycle changes  Musculoskeletal: Negative.   Skin: Negative.   Neurological: Negative.   Endo/Heme/Allergies: Negative.   Psychiatric/Behavioral: Negative.     Health Maintenance: Pap:  08-10-14 neg HPV HR neg, 02-07-16 neg HPV HR neg History of Abnormal Pap: no MMG:  12-17-17 category c density birads 1:neg Self Breast exams: yes Colonoscopy:  2016 inflammatory polyp f/u 79yrs BMD:   none TDaP:  2008 will update today Shingles: no Pneumonia: no Hep C and HIV: both neg 2017 Labs: if needed.   reports that she quit smoking about 26 years ago. Her smoking use included cigarettes. She started smoking about 27 years ago. She has never used smokeless tobacco. She reports that she drinks alcohol. She reports that she does not use drugs.  Past Medical History:  Diagnosis Date  . ADD (attention deficit disorder)   . Anxiety   . Arthritis   . Depression   . Family history of malignant neoplasm of breast   . Hashimoto's disease   . Migraine with aura    as a teenager  . Other specified  disorders of thyroid    increased thyroid antibodies  . STD (sexually transmitted disease)    HSV1    Past Surgical History:  Procedure Laterality Date  . APPENDECTOMY  1985?  Marland Kitchen arm surgery     age 46 rt arm surgery  . BREAST BIOPSY Right Jan 2015  . BREAST BIOPSY Left 09/2014  . BREAST FIBROADENOMA SURGERY Left    age 64  . CHOLECYSTECTOMY  11/10/14  . COLONOSCOPY  07/27/2014   [facility]  . COLONOSCOPY  2002   [facility]  . LAPAROSCOPY  1995? and 2002  . TUBAL LIGATION     BTL with bands    Current Outpatient Medications  Medication Sig Dispense Refill  . Acetaminophen (TYLENOL PO) Take by mouth as needed.    . ALPRAZolam (XANAX) 0.5 MG tablet Take 0.5 mg by mouth 2 (two) times daily as needed.     Marland Kitchen amphetamine-dextroamphetamine (ADDERALL) 30 MG tablet 3 times/day as needed-between meals & bedtime.  0  . azithromycin (ZITHROMAX Z-PAK) 250 MG tablet Use as directed 6 each 0  . cholecalciferol (VITAMIN D) 1000 UNITS tablet Take 1,000 Units by mouth daily.    Marland Kitchen desvenlafaxine (PRISTIQ) 100 MG 24 hr tablet Take 1 tablet (100 mg total) by mouth daily. 30 tablet 2  . gabapentin (NEURONTIN) 100 MG capsule TAKE 2 CAPSULES (200 MG TOTAL) BY MOUTH 2 (  TWO) TIMES DAILY. 120 capsule 3  . levothyroxine (SYNTHROID, LEVOTHROID) 25 MCG tablet Take 1 tablet (25 mcg total) by mouth daily before breakfast. 90 tablet 3  . loratadine (CLARITIN) 10 MG tablet Take 10 mg by mouth daily.    . methylPREDNISolone (MEDROL DOSEPAK) 4 MG TBPK tablet Use as directed for 6 days 21 tablet 0  . Multiple Vitamins-Minerals (MULTIVITAMIN PO) Take by mouth daily.    . Naproxen Sodium (ALEVE PO) Take by mouth as needed.    . norethindrone (MICRONOR,CAMILA,ERRIN) 0.35 MG tablet TAKE 1 TABLET BY MOUTH EVERY DAY 28 tablet 11  . VYVANSE 60 MG capsule Take 60 mg by mouth every morning.  0  . zolpidem (AMBIEN) 10 MG tablet Take 10 mg by mouth Nightly.     No current facility-administered medications for  this visit.     Family History  Problem Relation Age of Onset  . Hypertension Mother   . Thyroid disease Mother   . Thyroid disease Sister   . Breast cancer Paternal Grandmother        59s; deceased    ROS:  Pertinent items are noted in HPI.  Otherwise, a comprehensive ROS was negative.  Exam:   There were no vitals taken for this visit.   Ht Readings from Last 3 Encounters:  10/29/16 5\' 5"  (1.651 m)  02/07/16 5' 4.75" (1.645 m)  11/15/14 5\' 4"  (1.626 m)    General appearance: alert, cooperative and appears stated age Head: Normocephalic, without obvious abnormality, atraumatic Neck: no adenopathy, supple, symmetrical, trachea midline and thyroid enlarged and nodular Lungs: clear to auscultation bilaterally Breasts: normal appearance, no masses or tenderness, No nipple retraction or dimpling, No nipple discharge or bleeding, No axillary or supraclavicular adenopathy Heart: regular rate and rhythm Abdomen: soft, non-tender; no masses,  no organomegaly Extremities: extremities normal, atraumatic, no cyanosis or edema Skin: Skin color, texture, turgor normal. No rashes or lesions Lymph nodes: Cervical, supraclavicular, and axillary nodes normal. No abnormal inguinal nodes palpated Neurologic: Grossly normal   Pelvic: External genitalia:  no lesions              Urethra:  normal appearing urethra with no masses, tenderness or lesions              Bartholin's and Skene's: normal                 Vagina: normal appearing vagina with normal color and discharge, no lesions              Cervix: multiparous appearance, no cervical motion tenderness, no lesions and tiny cervical polyp noted in cervical os              Pap taken: Yes.   Bimanual Exam:  Uterus:  normal size, contour, position, consistency, mobility, non-tender and anteverted              Adnexa: normal adnexa and no mass, fullness, tenderness               Rectovaginal: Confirms               Anus:  normal sphincter  tone, no lesions  Chaperone present: yes  A:  Well Woman with normal exam  Contraception BTL  Menorrhagia history and prolonged periods  Cervical polyp tiny  Depression with Psychiatric management  Hypothyroid with PCP management  Immunization update  P:   Reviewed health and wellness pertinent to exam  Discussed ablation with patient again. She is  ready to do now. Aware she will need endometrial biopsy and PUS again prior to procedure. ( had consult with Dr. Talbert Nan previously regarding and endo biopsy. Will check on needs prior to scheduling.  Discussed cervical polyp benign finding and etiology.  Continue follow up MD's regarding other health care as indicated.  Requests TDAP  Pap smear: yes   counseled on breast self exam, mammography screening, feminine hygiene, adequate intake of calcium and vitamin D, diet and exercise  return annually or prn  Addendum: Consulted with Dr. Talbert Nan regarding need to repeat PUS and Endometrial biopsy with [facility] for ablation and location for ablation if done. Previous PUS reviewed and small fibroid noted which does deviate cavity slightly, which may need to be removed for adequate ablation. This was the concern with ablation being done in office and recommended outpatient. If patient wants to proceed will need repeat PUS and SHG, Endo biopsy (schedule all together) and can plan ablation if no further concerns. Patient will be called with information.  An After Visit Summary was printed and given to the patient.

## 2018-04-03 ENCOUNTER — Telehealth: Payer: Self-pay | Admitting: *Deleted

## 2018-04-03 DIAGNOSIS — N841 Polyp of cervix uteri: Secondary | ICD-10-CM

## 2018-04-03 DIAGNOSIS — N920 Excessive and frequent menstruation with regular cycle: Secondary | ICD-10-CM

## 2018-04-03 NOTE — Telephone Encounter (Signed)
Left message to call Sharee Pimple at [phone].    Reviewed with Melvia Heaps, CNM, call patient to review recommendations as seen below:  Consulted with Dr. Talbert Nan regarding need to repeat PUS and Endometrial biopsy with [facility] for ablation and location for ablation if done. Previous PUS reviewed and small fibroid noted which does deviate cavity slightly, which may need to be removed for adequate ablation. This was the concern with ablation being done in office and recommended outpatient. If patient wants to proceed will need repeat PUS and SHG, Endo biopsy (schedule all together) and can plan ablation if no further concerns. Patient will be called with information

## 2018-04-03 NOTE — Telephone Encounter (Signed)
Spoke with patient, advised as seen below per Melvia Heaps, CNM and Dr. Talbert Nan. Patient request to proceed. Patient states she can only schedule on 10/8.   LMP 03/24/18 Tubal ligation for contraceptive.  PUS/SHGM/EMB scheduled for 04/21/18 at 2pm,consult at 2:30pm with Dr. Talbert Nan. Patient verbalizes understanding and is agreeable.   Orders placed for PUS, SHGM and EMB for precert.   Routing to provider for final review. Patient is agreeable to disposition. Will close encounter.  Cc: Melvia Heaps, CNM, Lerry Liner, Magdalene Patricia

## 2018-04-06 ENCOUNTER — Telehealth: Payer: Self-pay | Admitting: Obstetrics and Gynecology

## 2018-04-06 LAB — CYTOLOGY - PAP
DIAGNOSIS: NEGATIVE
HPV (WINDOPATH): NOT DETECTED

## 2018-04-06 NOTE — Telephone Encounter (Signed)
Call placed to patient to review benefits for scheduled appointment for sonohysterogram and endometrial biopsy. Left voicemail message requesting a return call

## 2018-04-06 NOTE — Telephone Encounter (Signed)
Patient returned call and requested a call back. Okay to leave details on voice mail.

## 2018-04-07 NOTE — Telephone Encounter (Signed)
Spoke with patient regarding benefit for scheduled sonohysterogram and endometrial biopsy. Patient understood and agreeable. Patient scheduled 04/21/18 with Dr Talbert Nan. Patient aware of appointment date, arrival time and cancellation policy.  No further questions. Ok to close

## 2018-04-08 ENCOUNTER — Ambulatory Visit (INDEPENDENT_AMBULATORY_CARE_PROVIDER_SITE_OTHER): Payer: 59

## 2018-04-08 DIAGNOSIS — Z23 Encounter for immunization: Secondary | ICD-10-CM | POA: Diagnosis not present

## 2018-04-17 ENCOUNTER — Telehealth: Payer: Self-pay | Admitting: Obstetrics and Gynecology

## 2018-04-17 NOTE — Telephone Encounter (Signed)
Patient canceled her upcoming PUS/SHGM/EMB 04/21/18 via the automated reminder call. I left a message that Deloris Ping or Basilia Jumbo would being giving her a call to reschedule.

## 2018-04-21 ENCOUNTER — Other Ambulatory Visit: Payer: Self-pay | Admitting: Obstetrics and Gynecology

## 2018-04-21 ENCOUNTER — Other Ambulatory Visit: Payer: Self-pay

## 2018-04-29 ENCOUNTER — Telehealth: Payer: Self-pay | Admitting: Obstetrics and Gynecology

## 2018-04-29 NOTE — Telephone Encounter (Signed)
See previous phone. Call placed to patient to reschedule the ultrasound with sonohysterogram and endometrial biopsy, patient had canceled appointment scheduled for 04/21/18. Left voicemail message requesting a return call.   cc: Dr Talbert Nan

## 2018-06-01 ENCOUNTER — Other Ambulatory Visit: Payer: Self-pay

## 2018-06-01 MED ORDER — ONDANSETRON HCL 8 MG PO TABS: 8.0000 mg | ORAL_TABLET | Freq: Three times a day (TID) | ORAL | 0 refills | Status: DC | PRN

## 2018-06-22 ENCOUNTER — Other Ambulatory Visit: Payer: Self-pay | Admitting: Adult Health

## 2018-06-22 MED ORDER — SODIUM FLUORIDE 1.1 % DT CREA: 1.0000 "application " | TOPICAL_CREAM | Freq: Every evening | DENTAL | 11 refills | Status: DC

## 2018-06-30 ENCOUNTER — Other Ambulatory Visit: Payer: Self-pay | Admitting: Internal Medicine

## 2018-07-04 ENCOUNTER — Other Ambulatory Visit: Payer: Self-pay | Admitting: Internal Medicine

## 2018-08-12 ENCOUNTER — Other Ambulatory Visit: Payer: Self-pay | Admitting: Internal Medicine

## 2018-09-08 ENCOUNTER — Other Ambulatory Visit: Payer: Self-pay | Admitting: Internal Medicine

## 2018-09-14 ENCOUNTER — Other Ambulatory Visit: Payer: Self-pay | Admitting: Adult Health

## 2018-09-14 MED ORDER — CHLORHEXIDINE GLUCONATE 0.12 % MT SOLN: 15.0000 mL | Freq: Two times a day (BID) | OROMUCOSAL | 3 refills | Status: DC

## 2018-09-14 NOTE — Progress Notes (Signed)
Mouth wash sent for patient.

## 2018-09-21 ENCOUNTER — Other Ambulatory Visit: Payer: Self-pay | Admitting: Adult Health

## 2018-09-21 MED ORDER — AMPHETAMINE-DEXTROAMPHETAMINE 20 MG PO TABS: 20.0000 mg | ORAL_TABLET | Freq: Two times a day (BID) | ORAL | 0 refills | Status: AC | PRN

## 2019-02-10 ENCOUNTER — Other Ambulatory Visit: Payer: Self-pay

## 2019-02-10 ENCOUNTER — Ambulatory Visit (INDEPENDENT_AMBULATORY_CARE_PROVIDER_SITE_OTHER): Payer: 59

## 2019-02-10 DIAGNOSIS — N390 Urinary tract infection, site not specified: Secondary | ICD-10-CM

## 2019-02-10 DIAGNOSIS — R300 Dysuria: Secondary | ICD-10-CM

## 2019-02-10 LAB — POCT URINALYSIS DIPSTICK
Bilirubin, UA: NEGATIVE
Glucose, UA: NEGATIVE
Ketones, UA: NEGATIVE
Leukocytes, UA: NEGATIVE
Nitrite, UA: NEGATIVE
Protein, UA: NEGATIVE
Spec Grav, UA: 1.01 (ref 1.010–1.025)
Urobilinogen, UA: 0.2 E.U./dL
pH, UA: 5 (ref 5.0–8.0)

## 2019-02-10 MED ORDER — NITROFURANTOIN MONOHYD MACRO 100 MG PO CAPS: 100.0000 mg | ORAL_CAPSULE | Freq: Two times a day (BID) | ORAL | 0 refills | Status: AC

## 2019-02-13 LAB — CULTURE, URINE COMPREHENSIVE

## 2019-03-26 ENCOUNTER — Ambulatory Visit (INDEPENDENT_AMBULATORY_CARE_PROVIDER_SITE_OTHER): Payer: 59

## 2019-03-26 DIAGNOSIS — Z111 Encounter for screening for respiratory tuberculosis: Secondary | ICD-10-CM | POA: Diagnosis not present

## 2019-03-29 LAB — TB SKIN TEST
Induration: 0 mm
TB Skin Test: NEGATIVE

## 2019-03-30 ENCOUNTER — Other Ambulatory Visit: Payer: Self-pay | Admitting: Internal Medicine

## 2019-04-07 ENCOUNTER — Ambulatory Visit: Payer: 59 | Admitting: Certified Nurse Midwife

## 2019-04-30 ENCOUNTER — Other Ambulatory Visit: Payer: Self-pay

## 2019-04-30 ENCOUNTER — Ambulatory Visit (INDEPENDENT_AMBULATORY_CARE_PROVIDER_SITE_OTHER): Payer: 59

## 2019-04-30 DIAGNOSIS — Z23 Encounter for immunization: Secondary | ICD-10-CM | POA: Diagnosis not present

## 2019-05-12 ENCOUNTER — Ambulatory Visit: Payer: 59 | Admitting: Certified Nurse Midwife

## 2019-06-02 ENCOUNTER — Other Ambulatory Visit: Payer: Self-pay | Admitting: Internal Medicine

## 2019-06-02 DIAGNOSIS — E039 Hypothyroidism, unspecified: Secondary | ICD-10-CM

## 2019-06-02 DIAGNOSIS — R5383 Other fatigue: Secondary | ICD-10-CM

## 2019-06-02 DIAGNOSIS — N920 Excessive and frequent menstruation with regular cycle: Secondary | ICD-10-CM

## 2019-06-02 DIAGNOSIS — E538 Deficiency of other specified B group vitamins: Secondary | ICD-10-CM

## 2019-06-02 NOTE — Progress Notes (Signed)
Order labs.

## 2019-06-03 LAB — ANA W/REFLEX IF POSITIVE: Anti Nuclear Antibody (ANA): NEGATIVE

## 2019-06-10 LAB — TSH+FREE T4
Free T4: 1.15 ng/dL (ref 0.82–1.77)
TSH: 0.444 u[IU]/mL — ABNORMAL LOW (ref 0.450–4.500)

## 2019-06-10 LAB — THYROID PEROXIDASE ANTIBODY: Thyroperoxidase Ab SerPl-aCnc: 300 IU/mL — ABNORMAL HIGH (ref 0–34)

## 2019-06-10 LAB — THYROGLOBULIN LEVEL: Thyroglobulin (TG-RIA): 17 ng/mL

## 2019-06-10 LAB — IRON,TIBC AND FERRITIN PANEL
Ferritin: 20 ng/mL (ref 15–150)
Iron Saturation: 11 % — ABNORMAL LOW (ref 15–55)
Iron: 42 ug/dL (ref 27–159)
Total Iron Binding Capacity: 384 ug/dL (ref 250–450)
UIBC: 342 ug/dL (ref 131–425)

## 2019-06-10 LAB — ANA W/REFLEX IF POSITIVE: Anti Nuclear Antibody (ANA): NEGATIVE

## 2019-06-10 LAB — B12 AND FOLATE PANEL
Folate: 16.3 ng/mL (ref >3.0)
Vitamin B-12: 502 pg/mL (ref 232–1245)

## 2019-06-14 ENCOUNTER — Other Ambulatory Visit: Payer: Self-pay

## 2019-06-14 MED ORDER — FLUTICASONE PROPIONATE 50 MCG/ACT NA SUSP: 2.0000 | Freq: Every day | NASAL | 0 refills | Status: DC

## 2019-06-14 MED ORDER — AZITHROMYCIN 250 MG PO TABS: ORAL_TABLET | ORAL | 0 refills | Status: DC

## 2019-06-23 ENCOUNTER — Ambulatory Visit: Payer: 59 | Admitting: Certified Nurse Midwife

## 2019-07-03 ENCOUNTER — Other Ambulatory Visit: Payer: Self-pay | Admitting: Internal Medicine

## 2019-07-14 ENCOUNTER — Other Ambulatory Visit: Payer: Self-pay

## 2019-09-01 ENCOUNTER — Ambulatory Visit: Payer: 59 | Admitting: Certified Nurse Midwife

## 2019-10-01 ENCOUNTER — Encounter: Payer: Self-pay | Admitting: Certified Nurse Midwife

## 2019-12-14 ENCOUNTER — Other Ambulatory Visit: Payer: Self-pay

## 2019-12-14 MED ORDER — LINACLOTIDE 72 MCG PO CAPS: 72.0000 ug | ORAL_CAPSULE | Freq: Every day | ORAL | 1 refills | Status: DC

## 2019-12-14 MED ORDER — TRULANCE 3 MG PO TABS: 3.0000 mg | ORAL_TABLET | Freq: Every day | ORAL | 1 refills | Status: DC

## 2019-12-29 ENCOUNTER — Ambulatory Visit: Payer: 59 | Admitting: Certified Nurse Midwife

## 2020-01-03 ENCOUNTER — Other Ambulatory Visit: Payer: Self-pay | Admitting: Internal Medicine

## 2020-01-03 DIAGNOSIS — Z1231 Encounter for screening mammogram for malignant neoplasm of breast: Secondary | ICD-10-CM

## 2020-01-12 ENCOUNTER — Ambulatory Visit
Admission: RE | Admit: 2020-01-12 | Discharge: 2020-01-12 | Disposition: A | Discharge status: Home / Self Care | Payer: 59 | Source: Ambulatory Visit | Attending: Internal Medicine | Admitting: Internal Medicine

## 2020-01-12 DIAGNOSIS — Z1231 Encounter for screening mammogram for malignant neoplasm of breast: Secondary | ICD-10-CM | POA: Diagnosis not present

## 2020-06-02 ENCOUNTER — Ambulatory Visit (INDEPENDENT_AMBULATORY_CARE_PROVIDER_SITE_OTHER): Payer: 59

## 2020-06-02 DIAGNOSIS — Z23 Encounter for immunization: Secondary | ICD-10-CM

## 2020-06-09 ENCOUNTER — Other Ambulatory Visit: Payer: Self-pay | Admitting: Internal Medicine

## 2020-07-09 ENCOUNTER — Other Ambulatory Visit: Payer: Self-pay | Admitting: Internal Medicine

## 2020-08-02 ENCOUNTER — Other Ambulatory Visit (INDEPENDENT_AMBULATORY_CARE_PROVIDER_SITE_OTHER): Payer: 59

## 2020-08-02 DIAGNOSIS — E039 Hypothyroidism, unspecified: Secondary | ICD-10-CM

## 2020-08-11 ENCOUNTER — Other Ambulatory Visit: Payer: Self-pay

## 2020-08-11 DIAGNOSIS — E039 Hypothyroidism, unspecified: Secondary | ICD-10-CM

## 2020-11-17 ENCOUNTER — Other Ambulatory Visit: Payer: Self-pay | Admitting: Physician Assistant

## 2020-11-17 DIAGNOSIS — M7070 Other bursitis of hip, unspecified hip: Secondary | ICD-10-CM

## 2020-11-17 MED ORDER — METHYLPREDNISOLONE 4 MG PO TBPK: ORAL_TABLET | ORAL | 0 refills | Status: DC

## 2020-11-20 ENCOUNTER — Ambulatory Visit (INDEPENDENT_AMBULATORY_CARE_PROVIDER_SITE_OTHER): Payer: 59 | Admitting: Physician Assistant

## 2020-11-20 ENCOUNTER — Other Ambulatory Visit: Payer: Self-pay

## 2020-11-20 DIAGNOSIS — Z7689 Persons encountering health services in other specified circumstances: Secondary | ICD-10-CM | POA: Diagnosis not present

## 2020-11-20 DIAGNOSIS — M7070 Other bursitis of hip, unspecified hip: Secondary | ICD-10-CM

## 2020-11-20 NOTE — Progress Notes (Signed)
New Patient Office Visit  Subjective:  Patient ID: Nichole Campos, female    DOB: 1971-05-21  Age: 50 y.o. MRN: [id number]  CC: No chief complaint on file.   HPI [name] presents to establish care and with c/o left hip pain x several days. Patient saw chiropractor last week who dx hip bursitis. Patient reports low back/hip pain is constant and exacerbated with movement or prolonged period of standing. Denies injury or trauma. Does reports numbness sensation from left knee down. Has tried corticosteroid therapy with Solu-medrol and Medrol pak which has provided minimal relief. Alternating Tylenol and Ibuprofen.   Past Medical History:  Diagnosis Date  . ADD (attention deficit disorder)   . Anxiety   . Arthritis   . Depression   . Family history of malignant neoplasm of breast   . Hashimoto's disease   . Migraine with aura    as a teenager  . 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 STD (sexually transmitted disease)    HSV1    Past Surgical History:  Procedure Laterality Date  . APPENDECTOMY  1985?  Marland Kitchen arm surgery     age 89 rt arm surgery  . BREAST BIOPSY Right Jan 2015  . BREAST BIOPSY Left 09/2014  . BREAST EXCISIONAL BIOPSY    . BREAST FIBROADENOMA SURGERY Left    age 6  . CHOLECYSTECTOMY  11/10/14  . COLONOSCOPY  07/27/2014   [facility]  . COLONOSCOPY  2002   [facility]  . LAPAROSCOPY  1995? and 2002  . TUBAL LIGATION     BTL with bands    Family History  Problem Relation Age of Onset  . Hypertension Mother   . Thyroid disease Mother   . Thyroid disease Sister   . Breast cancer Paternal Grandmother        67s; deceased    Social History   Socioeconomic History  . Marital status: Married    Spouse name: Not on file  . Number of children: 1  . Years of education: Not on file  . Highest education level: Not on file  Occupational History    Employer: [employer]  Tobacco Use  . Smoking status:  Former Smoker    Types: Cigarettes    Start date: 07/15/1990    Quit date: 07/16/1991    Years since quitting: 29.3  . Smokeless tobacco: Never Used  . Tobacco comment: age 60 for about 31yr  Substance and Sexual Activity  . Alcohol use: Yes    Comment: 1 Per month  . Drug use: No  . Sexual activity: Yes    Partners: Male    Birth control/protection: Surgical    Comment: BTL  Other Topics Concern  . Not on file  Social History Narrative  . Not on file   Social Determinants of Health   Financial Resource Strain: Not on file  Food Insecurity: Not on file  Transportation Needs: Not on file  Physical Activity: Not on file  Stress: Not on file  Social Connections: Not on file  Intimate Partner Violence: Not on file    ROS Review of Systems A fourteen system review of systems was performed and found to be positive as per HPI.  Objective:   Today's Vitals: There were no vitals taken for this visit.  Physical Exam General:  Well Developed, well nourished, in no acute distress Neuro:  Alert and oriented,  extra-ocular muscles intact  HEENT:  Normocephalic,  atraumatic, neck supple  Skin:  no gross rash, warm, pink. Cardiac:  RRR Respiratory:  Not using accessory muscles, speaking in full sentences- unlabored. MSK: TTP of left lower back/hip area, limited ROM, antalgic gait  Vascular:  Ext warm, no cyanosis apprec.; cap RF less 2 sec. Psych:  No HI/SI, judgement and insight good, Euthymic mood. Full Affect.  Assessment & Plan:   Problem List Items Addressed This Visit   None   Visit Diagnoses    Encounter to establish care    -  Primary   Bursitis of hip, unspecified bursa, unspecified laterality       Relevant Orders   Ambulatory referral to Orthopedics     Hip Bursitis: -Patient has failed therapy with corticosteroid therapy and anti-inflammatory. Will place referral to Orthopedics for further evaluation and management. -Recommend to continue conservative therapy  with heat/ice therapy and anti-inflammatory as needed for pain relief.  Outpatient Encounter Medications as of 11/20/2020  Medication Sig  . Acetaminophen (TYLENOL PO) Take by mouth as needed.  . ALPRAZolam (XANAX) 0.5 MG tablet Take 0.5 mg by mouth 2 (two) times daily as needed.   Marland Kitchen amphetamine-dextroamphetamine (ADDERALL) 20 MG tablet Take 1 tablet (20 mg total) by mouth 2 (two) times daily as needed.  Marland Kitchen azithromycin (ZITHROMAX) 250 MG tablet Take as directed for 5 days for sinusitis  . chlorhexidine (PERIDEX) 0.12 % solution Use as directed 15 mLs in the mouth or throat 2 (two) times daily.  . cholecalciferol (VITAMIN D) 1000 UNITS tablet Take 1,000 Units by mouth daily.  Marland Kitchen desvenlafaxine (PRISTIQ) 100 MG 24 hr tablet Take 1 tablet (100 mg total) by mouth daily.  . fluticasone (FLONASE) 50 MCG/ACT nasal spray Place 2 sprays into both nostrils daily.  Marland Kitchen gabapentin (NEURONTIN) 100 MG capsule TAKE 2 CAPSULES BY MOUTH 2 TIMES DAILY  . levothyroxine (SYNTHROID) 25 MCG tablet TAKE 1 TABLET (25 MCG TOTAL) BY MOUTH DAILY BEFORE BREAKFAST.  Marland Kitchen LINZESS 72 MCG capsule TAKE 1 CAPSULE (72 MCG TOTAL) BY MOUTH DAILY BEFORE BREAKFAST.  Marland Kitchen loratadine (CLARITIN) 10 MG tablet Take 10 mg by mouth daily.  . methylPREDNISolone (MEDROL DOSEPAK) 4 MG TBPK tablet Follow package instructions.  . Multiple Vitamins-Minerals (MULTIVITAMIN PO) Take by mouth daily.  . Naproxen Sodium (ALEVE PO) Take by mouth as needed.  . ondansetron (ZOFRAN) 8 MG tablet Take 1 tablet (8 mg total) by mouth 3 (three) times daily as needed for nausea or vomiting.  Marland Kitchen Plecanatide (TRULANCE) 3 MG TABS Take 3 mg by mouth daily.  . sodium fluoride (PREVIDENT 5000 PLUS) 1.1 % CREA dental cream Place 1 application onto teeth every evening.  . zolpidem (AMBIEN) 10 MG tablet Take 10 mg by mouth Nightly.   No facility-administered encounter medications on file as of 11/20/2020.    Follow-up: No follow-ups on file.   Lorrene Reid, PA-C

## 2020-12-15 ENCOUNTER — Other Ambulatory Visit: Payer: Self-pay | Admitting: Internal Medicine

## 2021-02-11 ENCOUNTER — Other Ambulatory Visit: Payer: Self-pay | Admitting: Internal Medicine

## 2021-05-15 ENCOUNTER — Other Ambulatory Visit: Payer: Self-pay

## 2021-05-15 DIAGNOSIS — E039 Hypothyroidism, unspecified: Secondary | ICD-10-CM

## 2021-05-15 MED ORDER — LEVOTHYROXINE SODIUM 25 MCG PO TABS: 25.0000 ug | ORAL_TABLET | Freq: Every day | ORAL | 2 refills | Status: DC

## 2021-06-06 ENCOUNTER — Other Ambulatory Visit: Payer: Self-pay

## 2021-06-06 ENCOUNTER — Other Ambulatory Visit: Payer: 59

## 2021-06-06 DIAGNOSIS — R300 Dysuria: Secondary | ICD-10-CM

## 2021-06-09 LAB — URINE CULTURE

## 2021-10-01 ENCOUNTER — Encounter: Payer: Self-pay | Admitting: Physician Assistant

## 2021-10-01 ENCOUNTER — Other Ambulatory Visit: Payer: Self-pay | Admitting: Internal Medicine

## 2021-11-21 IMAGING — MG DIGITAL SCREENING BILAT W/ TOMO W/ CAD
8 series · 8 of 24 positions shown · non-contrast
Comparison: Previous exam(s).

CLINICAL DATA: Screening.

EXAM:
DIGITAL SCREENING BILATERAL MAMMOGRAM WITH TOMO AND CAD

[R MLO synth-2D]
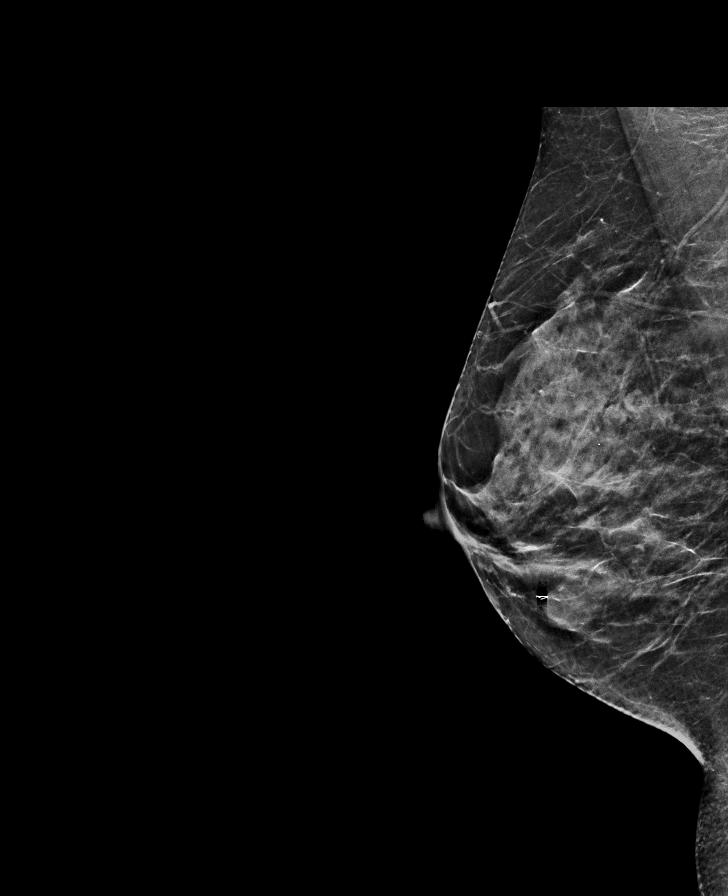

[L CC synth-2D]
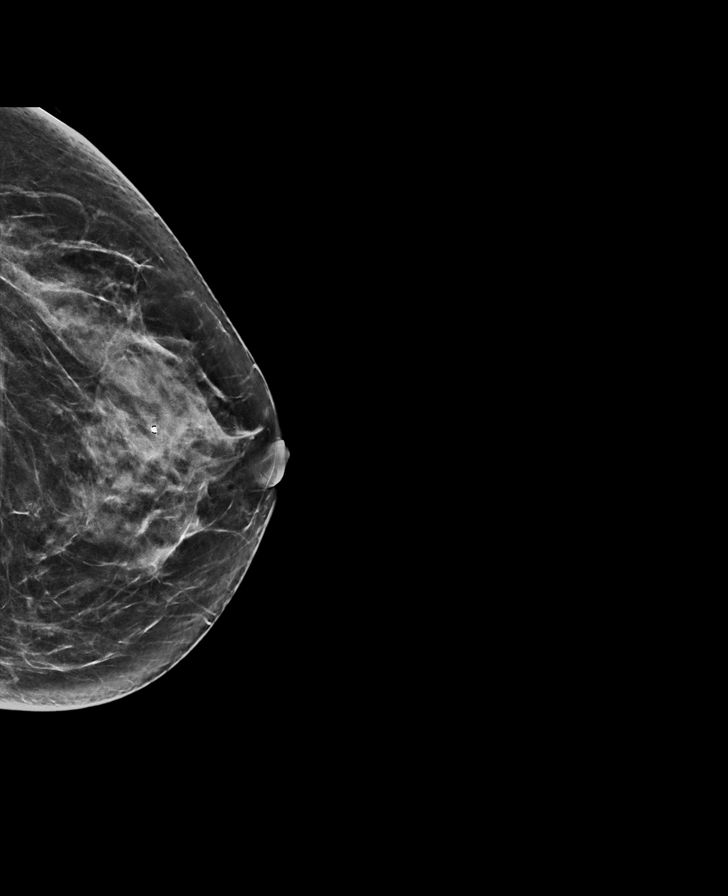

[R CC synth-2D]
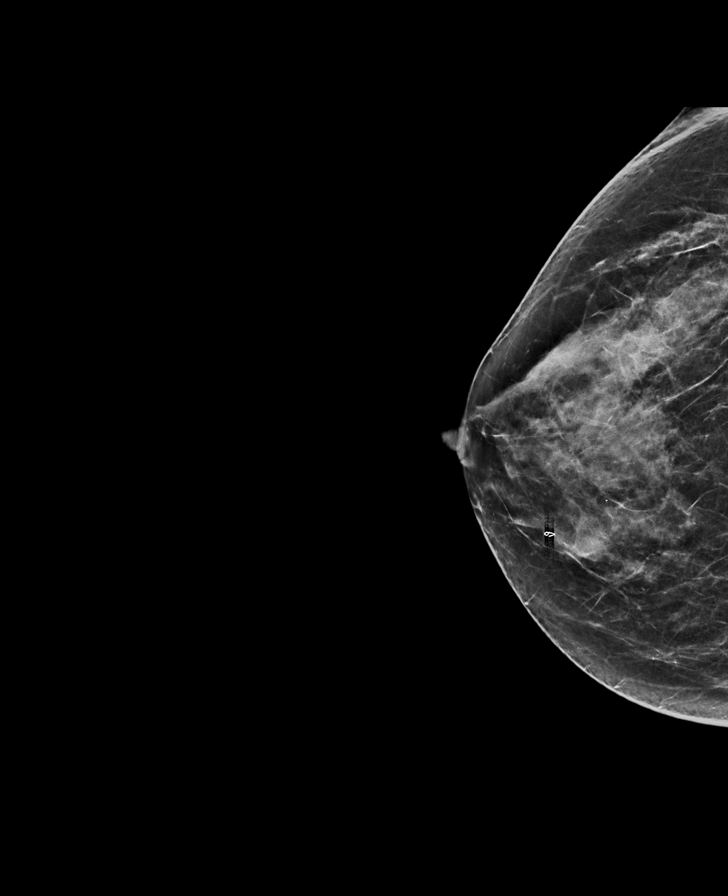

[L MLO synth-2D]
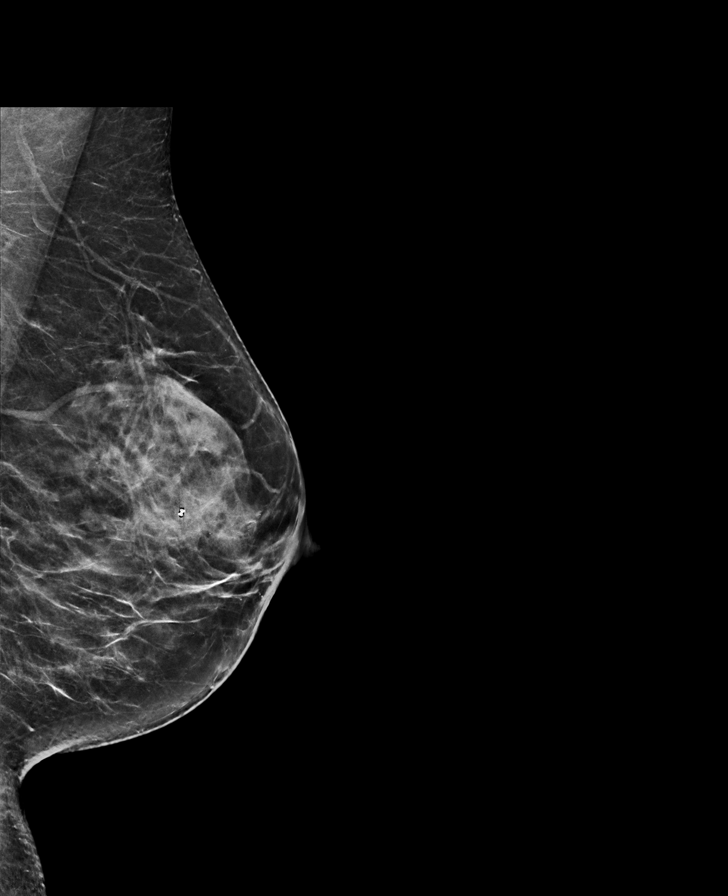

[L CC tomo · tomo slice 31/60.0]
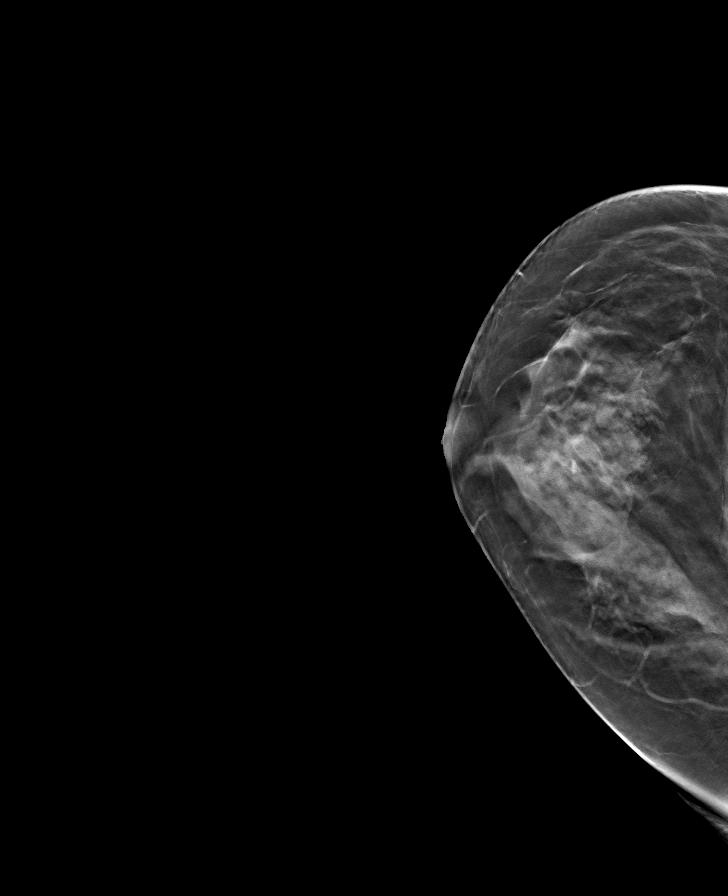

[L MLO tomo · tomo slice 31/61.0]
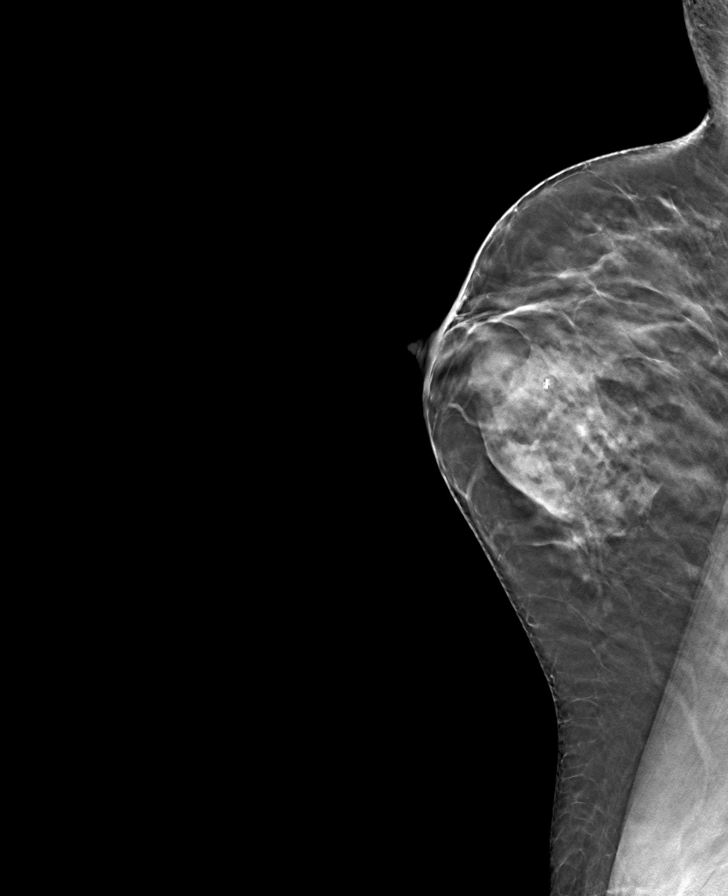

[R MLO tomo · tomo slice 29/57.0]
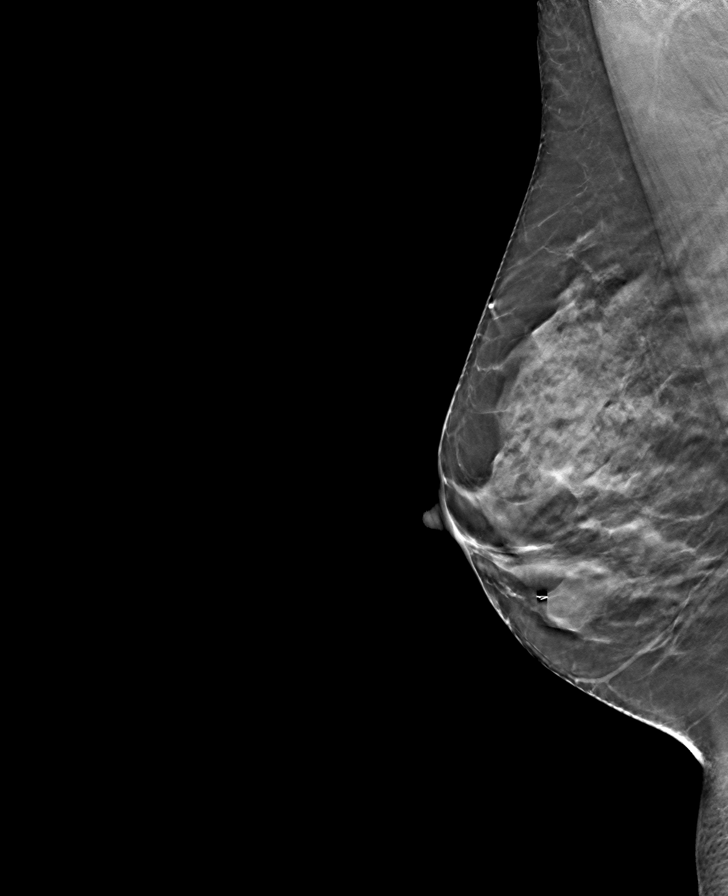

[R CC tomo · tomo slice 31/60.0]
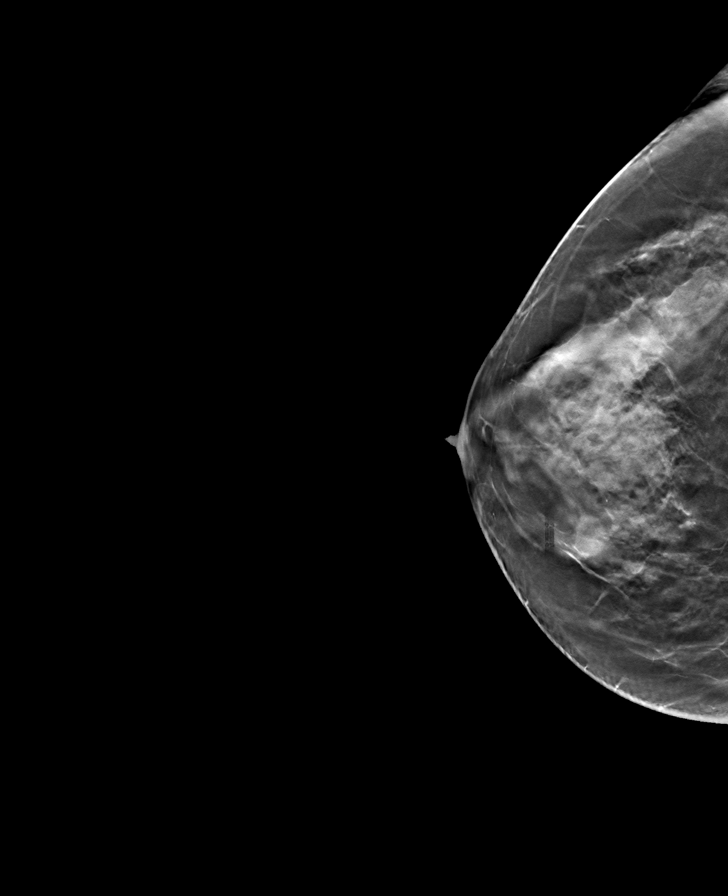

[8 of 24 positions shown; findings below may reference images not displayed]

ACR Breast Density Category c: The breast tissue is heterogeneously
dense, which may obscure small masses.
FINDINGS: There are no findings suspicious for malignancy. Images were
processed with CAD.
IMPRESSION: No mammographic evidence of malignancy. A result letter of this
screening mammogram will be mailed directly to the patient.

RECOMMENDATION:
Screening mammogram in one year. (Code:[id number])

BI-RADS CATEGORY  1: Negative.

## 2021-11-26 ENCOUNTER — Other Ambulatory Visit: Payer: Self-pay | Admitting: Physician Assistant

## 2021-11-26 DIAGNOSIS — E669 Obesity, unspecified: Secondary | ICD-10-CM

## 2021-11-26 MED ORDER — WEGOVY 0.5 MG/0.5ML ~~LOC~~ SOAJ: 0.5000 mg | SUBCUTANEOUS | 2 refills | Status: DC

## 2021-11-26 NOTE — Progress Notes (Signed)
Patient tolerated Wegovy 0.25 mg once a week without issues. Weight before starting medication therapy was 193 pounds and current weight is 175 pounds. MA, PA-C ?

## 2022-01-24 ENCOUNTER — Ambulatory Visit (INDEPENDENT_AMBULATORY_CARE_PROVIDER_SITE_OTHER): Payer: 59 | Admitting: Physician Assistant

## 2022-01-24 ENCOUNTER — Emergency Department (HOSPITAL_BASED_OUTPATIENT_CLINIC_OR_DEPARTMENT_OTHER): Payer: 59

## 2022-01-24 ENCOUNTER — Emergency Department (HOSPITAL_BASED_OUTPATIENT_CLINIC_OR_DEPARTMENT_OTHER)
Admission: EM | Admit: 2022-01-24 | Discharge: 2022-01-24 | Disposition: A | Discharge status: Home / Self Care | Payer: 59 | Attending: Emergency Medicine | Admitting: Emergency Medicine

## 2022-01-24 ENCOUNTER — Encounter (HOSPITAL_BASED_OUTPATIENT_CLINIC_OR_DEPARTMENT_OTHER): Payer: Self-pay | Admitting: Emergency Medicine

## 2022-01-24 VITALS — BP 145/96 | HR 75 | Temp 97.7°F | Ht 64.0 in | Wt 172.0 lb

## 2022-01-24 DIAGNOSIS — R110 Nausea: Secondary | ICD-10-CM | POA: Diagnosis not present

## 2022-01-24 DIAGNOSIS — R1013 Epigastric pain: Secondary | ICD-10-CM | POA: Diagnosis not present

## 2022-01-24 DIAGNOSIS — R1010 Upper abdominal pain, unspecified: Secondary | ICD-10-CM

## 2022-01-24 DIAGNOSIS — Z79899 Other long term (current) drug therapy: Secondary | ICD-10-CM | POA: Insufficient documentation

## 2022-01-24 DIAGNOSIS — K529 Noninfective gastroenteritis and colitis, unspecified: Secondary | ICD-10-CM

## 2022-01-24 DIAGNOSIS — R109 Unspecified abdominal pain: Secondary | ICD-10-CM | POA: Diagnosis present

## 2022-01-24 DIAGNOSIS — Z91040 Latex allergy status: Secondary | ICD-10-CM | POA: Diagnosis not present

## 2022-01-24 LAB — COMPREHENSIVE METABOLIC PANEL
ALT: 14 U/L (ref 0–44)
AST: 16 U/L (ref 15–41)
Albumin: 5 g/dL (ref 3.5–5.0)
Alkaline Phosphatase: 63 U/L (ref 38–126)
Anion gap: 11 (ref 5–15)
BUN: 9 mg/dL (ref 6–20)
CO2: 24 mmol/L (ref 22–32)
Calcium: 10.3 mg/dL (ref 8.9–10.3)
Chloride: 100 mmol/L (ref 98–111)
Creatinine, Ser: 0.62 mg/dL (ref 0.44–1.00)
GFR, Estimated: >60 mL/min (ref >60)
Glucose, Bld: 120 mg/dL — ABNORMAL HIGH (ref 70–99)
Potassium: 4.4 mmol/L (ref 3.5–5.1)
Sodium: 135 mmol/L (ref 135–145)
Total Bilirubin: 0.5 mg/dL (ref 0.3–1.2)
Total Protein: 7.7 g/dL (ref 6.5–8.1)

## 2022-01-24 LAB — CBC
HCT: 39.5 % (ref 36.0–46.0)
Hemoglobin: 13.1 g/dL (ref 12.0–15.0)
MCH: 29.8 pg (ref 26.0–34.0)
MCHC: 33.2 g/dL (ref 30.0–36.0)
MCV: 90 fL (ref 80.0–100.0)
Platelets: 313 10*3/uL (ref 150–400)
RBC: 4.39 MIL/uL (ref 3.87–5.11)
RDW: 13.5 % (ref 11.5–15.5)
WBC: 6.4 10*3/uL (ref 4.0–10.5)
nRBC: 0 % (ref 0.0–0.2)

## 2022-01-24 LAB — URINALYSIS, ROUTINE W REFLEX MICROSCOPIC
Bilirubin Urine: NEGATIVE
Glucose, UA: NEGATIVE mg/dL
Leukocytes,Ua: NEGATIVE
Nitrite: NEGATIVE
Protein, ur: 30 mg/dL — AB
RBC / HPF: >50 RBC/hpf — ABNORMAL HIGH (ref 0–5)
Specific Gravity, Urine: 1.031 — ABNORMAL HIGH (ref 1.005–1.030)
pH: 6.5 (ref 5.0–8.0)

## 2022-01-24 LAB — LIPASE, BLOOD: Lipase: 11 U/L (ref 11–51)

## 2022-01-24 LAB — PREGNANCY, URINE: Preg Test, Ur: NEGATIVE

## 2022-01-24 MED ORDER — HYDROMORPHONE HCL 1 MG/ML IJ SOLN
0.5000 mg | Freq: Once | INTRAMUSCULAR | Status: AC
  Administered 2022-01-24: 0.5 mg via INTRAVENOUS
  Filled 2022-01-24: qty 1

## 2022-01-24 MED ORDER — IOPAMIDOL (ISOVUE-M 300) INJECTION 61%
15.0000 mL | Freq: Once | INTRAMUSCULAR | Status: AC | PRN
  Administered 2022-01-24: 15 mL via INTRATHECAL

## 2022-01-24 MED ORDER — ONDANSETRON HCL 4 MG/2ML IJ SOLN
4.0000 mg | Freq: Once | INTRAMUSCULAR | Status: AC
  Administered 2022-01-24: 4 mg via INTRAVENOUS
  Filled 2022-01-24: qty 2

## 2022-01-24 MED ORDER — LACTATED RINGERS IV BOLUS
1000.0000 mL | Freq: Once | INTRAVENOUS | Status: AC
  Administered 2022-01-24: 1000 mL via INTRAVENOUS

## 2022-01-24 MED ORDER — ALUM & MAG HYDROXIDE-SIMETH 200-200-20 MG/5ML PO SUSP
30.0000 mL | Freq: Once | ORAL | Status: AC
  Administered 2022-01-24: 30 mL via ORAL
  Filled 2022-01-24: qty 30

## 2022-01-24 MED ORDER — SODIUM CHLORIDE 0.9 % IV BOLUS
1000.0000 mL | Freq: Once | INTRAVENOUS | Status: AC
  Administered 2022-01-24: 1000 mL via INTRAVENOUS

## 2022-01-24 MED ORDER — ONDANSETRON HCL 8 MG PO TABS: 8.0000 mg | ORAL_TABLET | Freq: Three times a day (TID) | ORAL | 0 refills | Status: DC | PRN

## 2022-01-24 MED ORDER — HYOSCYAMINE SULFATE 0.125 MG PO TABS: 0.1250 mg | ORAL_TABLET | Freq: Four times a day (QID) | ORAL | 0 refills | Status: AC | PRN

## 2022-01-24 MED ORDER — FAMOTIDINE 20 MG PO TABS
20.0000 mg | ORAL_TABLET | Freq: Once | ORAL | Status: AC
  Administered 2022-01-24: 20 mg via ORAL
  Filled 2022-01-24: qty 1

## 2022-01-24 MED ORDER — ONDANSETRON HCL 4 MG/2ML IJ SOLN
4.0000 mg | Freq: Once | INTRAMUSCULAR | Status: DC | PRN
  Filled 2022-01-24: qty 2

## 2022-01-24 MED ORDER — HYDROMORPHONE HCL 1 MG/ML IJ SOLN
1.0000 mg | Freq: Once | INTRAMUSCULAR | Status: DC
  Filled 2022-01-24: qty 1

## 2022-01-24 NOTE — ED Triage Notes (Signed)
Pt endorses upper abd pain that started around 6 am. Endorses nausea. Pain is sharp and stabbing. Pt has had gallbladder and appendix removed.

## 2022-01-24 NOTE — ED Notes (Signed)
Pt requested a smaller dose of Dilaudid or a different pain medication. Provider informed.

## 2022-01-24 NOTE — ED Provider Notes (Signed)
[facility] EMERGENCY DEPT Provider Note   CSN: [id number] Arrival date & time: 01/24/22  1603     History  Chief Complaint  Patient presents with   Abdominal Pain    Nichole Campos is a 51 y.o. female.  Pt c/o mid to upper abd pain in the past day. Symptoms acute onset at rest, mod-sev, constant, dull, non radiating. No hx same pain. Remote hx cholecystectomy 2016. No hx pud, or pancreatitis. No back or flank pain. No lower abd or pelvic pain. On period now. No dysuria or gu c/o. No fever or chills. No chest pain or discomfort. No sob. No cough or uri symptoms.   The history is provided by the patient and medical records.  Abdominal Pain Associated symptoms: nausea   Associated symptoms: no chest pain, no chills, no constipation, no cough, no diarrhea, no dysuria, no fever, no shortness of breath, no sore throat and no vomiting        Home Medications Prior to Admission medications   Medication Sig Start Date End Date Taking? Authorizing Provider  Acetaminophen (TYLENOL PO) Take by mouth as needed.    [provider]  ALPRAZolam Duanne Moron) 0.5 MG tablet Take 0.5 mg by mouth 2 (two) times daily as needed.  05/21/14   [provider]  amphetamine-dextroamphetamine (ADDERALL) 20 MG tablet Take 1 tablet (20 mg total) by mouth 2 (two) times daily as needed. 09/21/18   Kendell Bane, NP  cholecalciferol (VITAMIN D) 1000 UNITS tablet Take 1,000 Units by mouth daily.    [provider]  desvenlafaxine (PRISTIQ) 100 MG 24 hr tablet Take 1 tablet (100 mg total) by mouth daily. 08/04/17   Lavera Guise, MD  gabapentin (NEURONTIN) 100 MG capsule TAKE 2 CAPSULES BY MOUTH 2 TIMES DAILY 07/10/20   Lavera Guise, MD  hyoscyamine (LEVSIN) 0.125 MG tablet Take 1 tablet (0.125 mg total) by mouth every 6 (six) hours as needed. 01/24/22   Lorrene Reid, PA-C  levothyroxine (SYNTHROID) 25 MCG tablet Take 1 tablet (25 mcg total) by mouth daily before breakfast.  05/15/21   Abonza, Maritza, PA-C  LINZESS 72 MCG capsule TAKE 1 CAPSULE (72 MCG TOTAL) BY MOUTH DAILY BEFORE BREAKFAST. 07/10/20   Lavera Guise, MD  loratadine (CLARITIN) 10 MG tablet Take 10 mg by mouth daily.    [provider]  methylPREDNISolone (MEDROL DOSEPAK) 4 MG TBPK tablet Follow package instructions. 11/17/20   Lorrene Reid, PA-C  Multiple Vitamins-Minerals (MULTIVITAMIN PO) Take by mouth daily.    [provider]  Naproxen Sodium (ALEVE PO) Take by mouth as needed.    [provider]  ondansetron (ZOFRAN) 8 MG tablet Take 1 tablet (8 mg total) by mouth 3 (three) times daily as needed for nausea or vomiting. 01/24/22   Lorrene Reid, PA-C  Semaglutide-Weight Management (WEGOVY) 0.5 MG/0.5ML SOAJ Inject 0.5 mg into the skin once a week. 11/26/21   Lorrene Reid, PA-C  zolpidem (AMBIEN) 10 MG tablet Take 10 mg by mouth Nightly. 05/21/14   [provider]      Allergies    Aspirin, Bactrim [sulfamethoxazole-trimethoprim], and Latex    Review of Systems   Review of Systems  Constitutional:  Negative for chills and fever.  HENT:  Negative for sore throat.   Eyes:  Negative for redness.  Respiratory:  Negative for cough and shortness of breath.   Cardiovascular:  Negative for chest pain.  Gastrointestinal:  Positive for abdominal pain and nausea. Negative for abdominal  distention, constipation, diarrhea and vomiting.  Genitourinary:  Negative for dysuria and flank pain.  Musculoskeletal:  Negative for back pain and neck pain.  Skin:  Negative for rash.  Neurological:  Negative for headaches.  Hematological:  Does not bruise/bleed easily.  Psychiatric/Behavioral:  Negative for confusion.     Physical Exam Updated Vital Signs BP (!) 138/93 (BP Location: Right Arm)   Pulse 68   Temp 97.8 F (36.6 C)   Resp 12   Ht 1.638 m (5' 4.5")   Wt 78 kg   LMP 01/24/2022   SpO2 100%   BMI 29.07 kg/m  Physical Exam Vitals and nursing note  reviewed.  Constitutional:      Appearance: Normal appearance. She is well-developed.  HENT:     Head: Atraumatic.     Nose: Nose normal.     Mouth/Throat:     Mouth: Mucous membranes are moist.  Eyes:     General: No scleral icterus.    Conjunctiva/sclera: Conjunctivae normal.  Neck:     Trachea: No tracheal deviation.  Cardiovascular:     Rate and Rhythm: Normal rate and regular rhythm.     Pulses: Normal pulses.     Heart sounds: Normal heart sounds. No murmur heard.    No friction rub. No gallop.  Pulmonary:     Effort: Pulmonary effort is normal. No respiratory distress.     Breath sounds: Normal breath sounds.  Abdominal:     General: Bowel sounds are normal. There is no distension.     Palpations: Abdomen is soft.     Tenderness: There is abdominal tenderness. There is no guarding.     Comments: Epigastric tenderness.   Genitourinary:    Comments: No cva tenderness.  Musculoskeletal:        General: No swelling.     Cervical back: Normal range of motion and neck supple. No rigidity. No muscular tenderness.  Skin:    General: Skin is warm and dry.     Findings: No rash.  Neurological:     Mental Status: She is alert.     Comments: Alert, speech normal.   Psychiatric:        Mood and Affect: Mood normal.     ED Results / Procedures / Treatments   Labs (all labs ordered are listed, but only abnormal results are displayed) Results for orders placed or performed during the hospital encounter of 01/24/22  Lipase, blood  Result Value Ref Range   Lipase 11 11 - 51 U/L  Comprehensive metabolic panel  Result Value Ref Range   Sodium 135 135 - 145 mmol/L   Potassium 4.4 3.5 - 5.1 mmol/L   Chloride 100 98 - 111 mmol/L   CO2 24 22 - 32 mmol/L   Glucose, Bld 120 (H) 70 - 99 mg/dL   BUN 9 6 - 20 mg/dL   Creatinine, Ser 0.62 0.44 - 1.00 mg/dL   Calcium 10.3 8.9 - 10.3 mg/dL   Total Protein 7.7 6.5 - 8.1 g/dL   Albumin 5.0 3.5 - 5.0 g/dL   AST 16 15 - 41 U/L   ALT  14 0 - 44 U/L   Alkaline Phosphatase 63 38 - 126 U/L   Total Bilirubin 0.5 0.3 - 1.2 mg/dL   GFR, Estimated >60 >60 mL/min   Anion gap 11 5 - 15  CBC  Result Value Ref Range   WBC 6.4 4.0 - 10.5 K/uL   RBC 4.39 3.87 - 5.11 MIL/uL  Hemoglobin 13.1 12.0 - 15.0 g/dL   HCT 39.5 36.0 - 46.0 %   MCV 90.0 80.0 - 100.0 fL   MCH 29.8 26.0 - 34.0 pg   MCHC 33.2 30.0 - 36.0 g/dL   RDW 13.5 11.5 - 15.5 %   Platelets 313 150 - 400 K/uL   nRBC 0.0 0.0 - 0.2 %  Urinalysis, Routine w reflex microscopic  Result Value Ref Range   Color, Urine YELLOW YELLOW   APPearance HAZY (A) CLEAR   Specific Gravity, Urine 1.031 (H) 1.005 - 1.030   pH 6.5 5.0 - 8.0   Glucose, UA NEGATIVE NEGATIVE mg/dL   Hgb urine dipstick LARGE (A) NEGATIVE   Bilirubin Urine NEGATIVE NEGATIVE   Ketones, ur TRACE (A) NEGATIVE mg/dL   Protein, ur 30 (A) NEGATIVE mg/dL   Nitrite NEGATIVE NEGATIVE   Leukocytes,Ua NEGATIVE NEGATIVE   RBC / HPF >50 (H) 0 - 5 RBC/hpf   WBC, UA 0-5 0 - 5 WBC/hpf   Squamous Epithelial / LPF 0-5 0 - 5   Mucus PRESENT   Pregnancy, urine  Result Value Ref Range   Preg Test, Ur NEGATIVE NEGATIVE     EKG None  Radiology CT Abdomen Pelvis W Contrast  Result Date: 01/24/2022 CLINICAL DATA:  Abdominal pain acute nonlocalized. EXAM: CT ABDOMEN AND PELVIS WITH CONTRAST TECHNIQUE: Multidetector CT imaging of the abdomen and pelvis was performed using the standard protocol following bolus administration of intravenous contrast. RADIATION DOSE REDUCTION: This exam was performed according to the departmental dose-optimization program which includes automated exposure control, adjustment of the mA and/or kV according to patient size and/or use of iterative reconstruction technique. CONTRAST:  100 cc of Omnipaque 300 COMPARISON:  No recent relevant priors available for comparison at time of dictation. FINDINGS: Lower chest: No acute abnormality. Hepatobiliary: No suspicious hepatic lesion. Gallbladder  surgically absent. Mild prominence of the biliary tree. Pancreas: No pancreatic ductal dilation or evidence of acute inflammation. Spleen: No splenomegaly or focal splenic lesion. Adrenals/Urinary Tract: Bilateral adrenal glands appear normal. No hydronephrosis. Kidneys demonstrate symmetric enhancement. Urinary bladder is nondistended limiting evaluation. Stomach/Bowel: No radiopaque enteric contrast material was administered. Mild wall thickening versus underdistention of the gastric antrum. Prominent loops of fluid-filled distal small bowel without abrupt transition to nondilated bowel. Gas fluid levels in the cecum with moderate volume of formed stool in the colon. Vascular/Lymphatic: Aortic atherosclerosis without abdominal aortic aneurysm. No pathologically enlarged abdominal or pelvic lymph nodes. Reproductive: Uterus and adnexa are unremarkable in CT appearance for reproductive age female. Other: No significant abdominopelvic free fluid. Musculoskeletal: L2-L3 disc space narrowing with Modic type endplate changes. L3-L4 disc space narrowing with vacuum disc artifact. 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 IMPRESSION: 1. Prominent loops of fluid-filled distal small bowel without abrupt transition to nondilated bowel, favored to reflect enteritis. However early/partial small bowel obstruction can not be excluded on this examination. Suggest close clinical interval follow-up. 2. Mild wall thickening versus underdistention of the gastric antrum. 3.  Aortic Atherosclerosis (ICD10-I70.0). Electronically Signed   By: Dahlia Bailiff M.D.   On: 01/24/2022 18:33    Procedures Procedures    Medications Ordered in ED Medications  ondansetron (ZOFRAN) injection 4 mg (has no administration in time range)  sodium chloride 0.9 % bolus 1,000 mL (has no administration in time range)  HYDROmorphone (DILAUDID) injection 1 mg (has no administration in time range)  ondansetron (ZOFRAN) injection 4 mg (has no  administration in time range)    ED Course/  Medical Decision Making/ A&P                           Medical Decision Making Problems Addressed: Enteritis: acute illness or injury with systemic symptoms Nausea: acute illness or injury with systemic symptoms Upper abdominal pain: acute illness or injury with systemic symptoms that poses a threat to life or bodily functions  Amount and/or Complexity of Data Reviewed Independent Historian:     Details: family, hx External Data Reviewed: notes. Labs: ordered. Decision-making details documented in ED Course. Radiology: ordered and independent interpretation performed. Decision-making details documented in ED Course.  Risk OTC drugs. Prescription drug management. Decision regarding hospitalization.   Iv ns. Continuous pulse ox and cardiac monitoring. Labs ordered/sent. Imaging ordered.   Diff dx includes gastritis, pud, sbo, gastroenteritis, etc - dispo decision including potential need for admission considered - will get labs and ct and reassess.   Reviewed nursing notes and prior charts for additional history. External reports reviewed. Additional history from: family, hx  Cardiac monitor: sinus rhythm, rate 68.  Labs reviewed/interpreted by me - wbc normal.   CT reviewed/interpreted by me - ?dilated loops bowel. ?enteritis.  Radiology comments on probable enteritis, cannot exclude partial sbo.  Discussed w pt. Abd is soft, non distended. No eemsis in ED.   Dilaudid iv. Zofran iv. Ns bolus iv.   Feels improved. Trial of po fluids.  Discussed if enteritis, hydrate/fluids, and also given possibility early/partial sbo, if increased pain or recurrent nv to return to ED for recheck.   Pt currently appears stable for d/c.  Return precautions provided.           Final Clinical Impression(s) / ED Diagnoses Final diagnoses:  None    Rx / DC Orders ED Discharge Orders     None         Lajean Saver, MD 01/24/22  2239

## 2022-01-24 NOTE — ED Notes (Signed)
0600 this morning woke up and was fine. While doing yoga pain started in the upper right quadrant. Rates 9/10. Increase with palpation and movement. Not used the restroom since this morning. No issues when using the restroom (no blood/pain). No recent trauma.

## 2022-01-24 NOTE — Discharge Instructions (Addendum)
It was our pleasure to provide your ER care today - we hope that you feel better.  Drink plenty of fluids/stay well hydrated.  Clear liquid diet for the next day, then advance as tolerated.   For symptom relief, try acid blocker medication such as prilosec or nexium. You may also try pepcid or maalox. Take zofran as need for nausea.   Follow up closely with primary care doctor in the next couple days if symptoms fail to improve/resolve.   Return to ER if worse, new symptoms, fevers, worsening or severe abdominal pain, recurrent or persistent vomiting, or other concern.   You were given pain medication in the ER - no driving for the next 6 hours.

## 2022-01-24 NOTE — Patient Instructions (Signed)
Abdominal Pain, Adult Many things can cause belly (abdominal) pain. Most times, belly pain is not dangerous. Many cases of belly pain can be watched and treated at home. Sometimes, though, belly pain is serious. Your doctor will try to find the cause of your belly pain. Follow these instructions at home:  Medicines Take over-the-counter and prescription medicines only as told by your doctor. Do not take medicines that help you poop (laxatives) unless told by your doctor. General instructions Watch your belly pain for any changes. Drink enough fluid to keep your pee (urine) pale yellow. Keep all follow-up visits as told by your doctor. This is important. Contact a doctor if: Your belly pain changes or gets worse. You are not hungry, or you lose weight without trying. You are having trouble pooping (constipated) or have watery poop (diarrhea) for more than 2-3 days. You have pain when you pee or poop. Your belly pain wakes you up at night. Your pain gets worse with meals, after eating, or with certain foods. You are vomiting and cannot keep anything down. You have a fever. You have blood in your pee. Get help right away if: Your pain does not go away as soon as your doctor says it should. You cannot stop vomiting. Your pain is only in areas of your belly, such as the right side or the left lower part of the belly. You have bloody or black poop, or poop that looks like tar. You have very bad pain, cramping, or bloating in your belly. You have signs of not having enough fluid or water in your body (dehydration), such as: Dark pee, very little pee, or no pee. Cracked lips. Dry mouth. Sunken eyes. Sleepiness. Weakness. You have trouble breathing or chest pain. Summary Many cases of belly pain can be watched and treated at home. Watch your belly pain for any changes. Take over-the-counter and prescription medicines only as told by your doctor. Contact a doctor if your belly pain  changes or gets worse. Get help right away if you have very bad pain, cramping, or bloating in your belly. This information is not intended to replace advice given to you by your health care provider. Make sure you discuss any questions you have with your health care provider. Document Revised: 11/09/2018 Document Reviewed: 11/09/2018 Elsevier Patient Education  2023 [employer]  

## 2022-01-24 NOTE — Progress Notes (Addendum)
Established patient acute visit   Patient: Nichole Campos   DOB: [birth year]   51 y.o. Female  MRN: [id number] Visit Date: 01/24/2022  Chief Complaint  Patient presents with   Abdominal Pain   Subjective    HPI  Patient presents with c/o acute upper abdominal pain that started early this morning. States pain feels like a stabbing pain. Does report having some acid reflux yesterday. Had crackers and peanut butter for dinner last night. Last bowel movement was yesterday and was normal. No bloody stools, fever, vomiting or changes in bowel habits. Does report mild nausea. States current pain level is 4/10 after taking Pepcid and Nexium. Movement makes the pain worse.      Medications: Outpatient Medications Prior to Visit  Medication Sig   Acetaminophen (TYLENOL PO) Take by mouth as needed.   ALPRAZolam (XANAX) 0.5 MG tablet Take 0.5 mg by mouth 2 (two) times daily as needed.    amphetamine-dextroamphetamine (ADDERALL) 20 MG tablet Take 1 tablet (20 mg total) by mouth 2 (two) times daily as needed.   cholecalciferol (VITAMIN D) 1000 UNITS tablet Take 1,000 Units by mouth daily.   desvenlafaxine (PRISTIQ) 100 MG 24 hr tablet Take 1 tablet (100 mg total) by mouth daily.   gabapentin (NEURONTIN) 100 MG capsule TAKE 2 CAPSULES BY MOUTH 2 TIMES DAILY   levothyroxine (SYNTHROID) 25 MCG tablet Take 1 tablet (25 mcg total) by mouth daily before breakfast.   LINZESS 72 MCG capsule TAKE 1 CAPSULE (72 MCG TOTAL) BY MOUTH DAILY BEFORE BREAKFAST.   loratadine (CLARITIN) 10 MG tablet Take 10 mg by mouth daily.   methylPREDNISolone (MEDROL DOSEPAK) 4 MG TBPK tablet Follow package instructions.   Multiple Vitamins-Minerals (MULTIVITAMIN PO) Take by mouth daily.   Naproxen Sodium (ALEVE PO) Take by mouth as needed.   Semaglutide-Weight Management (WEGOVY) 0.5 MG/0.5ML SOAJ Inject 0.5 mg into the skin once a week.   zolpidem (AMBIEN) 10 MG tablet Take 10 mg by mouth Nightly.   [DISCONTINUED] ondansetron  (ZOFRAN) 8 MG tablet Take 1 tablet (8 mg total) by mouth 3 (three) times daily as needed for nausea or vomiting.   No facility-administered medications prior to visit.    Review of Systems Review of Systems:  A fourteen system review of systems was performed and found to be positive as per HPI.  Last CBC Lab Results  Component Value Date   WBC 4.4 02/25/2018   HGB 12.6 02/25/2018   HCT 37.4 02/25/2018   MCV 90 02/25/2018   MCH 30.3 02/25/2018   RDW 15.1 02/25/2018   PLT 290 73/53/2992   Last metabolic panel Lab Results  Component Value Date   GLUCOSE 97 02/25/2018   NA 139 02/25/2018   K 4.3 02/25/2018   CL 103 02/25/2018   CO2 23 02/25/2018   BUN 12 02/25/2018   CREATININE 0.76 02/25/2018   GFRNONAA 96 02/25/2018   CALCIUM 9.6 02/25/2018   Last lipids Lab Results  Component Value Date   CHOL 166 02/07/2016   HDL 46 02/07/2016   LDLCALC 90 02/07/2016   TRIG 148 02/07/2016   CHOLHDL 3.6 02/07/2016   Last hemoglobin A1c No results found for: "HGBA1C"   Objective    BP (!) 145/96   Pulse 75   Temp 97.7 F (36.5 C)   Ht '5\' 4"'$  (1.626 m)   Wt 172 lb (78 kg)   SpO2 100%   BMI 29.52 kg/m    Physical Exam  General: Cooperative, in no acute distress,  ill-apperaing Neuro:  Alert and oriented,  extra-ocular muscles intact  HEENT:  Normocephalic, atraumatic, neck supple  Skin:  no gross rash, warm, pink. Abdomen: tenderness of upper abdomen especially epigastric region, some guarding noted, +BS Respiratory: Speaking in full sentences, unlabored. Vascular:  Ext warm, no cyanosis apprec.; cap RF less 2 sec. Psych:  No HI/SI, judgement and insight good, Euthymic mood. Full Affect.   No results found for any visits on 01/24/22.  Assessment & Plan     Etiology is unclear. Suspect IBS exacerbation, PUD, pancreatitis or gastritis. Patient prefers to wait on imaging at this time and monitor symptoms which is reasonable. Will send rx for Levsin 0.125 mg PO every 6  hrs as needed for pain/cramps and Zofran 8 mg as needed for nausea. Continue PPI therapy. If symptoms fail to improve or worsen then recommend further evaluation.    Return if symptoms worsen or fail to improve.        Lorrene Reid, PA-C  [facility] [phone] (phone) [phone] (fax)  [place]

## 2022-01-31 ENCOUNTER — Other Ambulatory Visit: Payer: Self-pay | Admitting: Physician Assistant

## 2022-01-31 DIAGNOSIS — R1010 Upper abdominal pain, unspecified: Secondary | ICD-10-CM

## 2022-01-31 DIAGNOSIS — K529 Noninfective gastroenteritis and colitis, unspecified: Secondary | ICD-10-CM

## 2022-02-05 ENCOUNTER — Other Ambulatory Visit: Payer: Self-pay | Admitting: Physician Assistant

## 2022-02-05 DIAGNOSIS — E039 Hypothyroidism, unspecified: Secondary | ICD-10-CM

## 2022-02-18 ENCOUNTER — Other Ambulatory Visit: Payer: Self-pay

## 2022-02-20 ENCOUNTER — Other Ambulatory Visit: Payer: Self-pay | Admitting: Gastroenterology

## 2022-02-20 ENCOUNTER — Other Ambulatory Visit: Payer: Self-pay

## 2022-02-20 ENCOUNTER — Encounter: Payer: Self-pay | Admitting: Gastroenterology

## 2022-02-20 ENCOUNTER — Ambulatory Visit (INDEPENDENT_AMBULATORY_CARE_PROVIDER_SITE_OTHER): Payer: 59 | Admitting: Gastroenterology

## 2022-02-20 VITALS — BP 124/84 | HR 80 | Temp 98.4°F | Ht 64.0 in | Wt 171.5 lb

## 2022-02-20 DIAGNOSIS — K529 Noninfective gastroenteritis and colitis, unspecified: Secondary | ICD-10-CM

## 2022-02-20 DIAGNOSIS — Z1211 Encounter for screening for malignant neoplasm of colon: Secondary | ICD-10-CM | POA: Diagnosis not present

## 2022-02-20 DIAGNOSIS — K2970 Gastritis, unspecified, without bleeding: Secondary | ICD-10-CM

## 2022-02-20 DIAGNOSIS — K2990 Gastroduodenitis, unspecified, without bleeding: Secondary | ICD-10-CM

## 2022-02-20 DIAGNOSIS — Z791 Long term (current) use of non-steroidal anti-inflammatories (NSAID): Secondary | ICD-10-CM

## 2022-02-20 MED ORDER — ESOMEPRAZOLE MAGNESIUM 40 MG PO CPDR: 40.0000 mg | DELAYED_RELEASE_CAPSULE | Freq: Every day | ORAL | 2 refills | Status: DC

## 2022-02-20 MED ORDER — NA SULFATE-K SULFATE-MG SULF 17.5-3.13-1.6 GM/177ML PO SOLN: 354.0000 mL | Freq: Once | ORAL | 0 refills | Status: AC

## 2022-02-20 NOTE — Progress Notes (Signed)
Cephas Darby, MD [address]  [place]  [place], [place] [address]  Main: [phone]  Fax: [phone]    Gastroenterology Consultation  Referring Provider:     Lorrene Reid, PA-C Primary Care Physician:  Lorrene Reid, PA-C Primary Gastroenterologist:  Dr. Cephas Darby Reason for Consultation: Abdominal discomfort        HPI:   Nichole Campos is a 51 y.o. female referred by Dr. Lorrene Reid, PA-C  for consultation & management of abdominal discomfort.  Patient went to the ER on 01/24/2022 secondary to severe upper abdominal discomfort associated with nausea, pain was sharp and stabbing.  She had history of cholecystectomy as well as appendectomy.  Labs including CBC, CMP, serum lipase, urine pregnancy test and urine analysis were unremarkable.  She underwent CT abdomen and pelvis with contrast which revealed possible gastritis and prominent loops of fluid-filled distal small bowel without abrupt transition reflecting enteritis.  Her pain improved within a week.  Although she is not having severe abdominal pain, she does report double abdominal discomfort, her appetite has slowly picked up and able to introduce more than bland foods.  Patient had herniated disc more than a year ago, has been taking Aleve twice daily long-term.  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   arm surgery     age 51 rt arm surgery   BREAST BIOPSY Right Jan 2015   BREAST BIOPSY Left 09/2014   BREAST EXCISIONAL BIOPSY     BREAST FIBROADENOMA SURGERY Left    age 66   CHOLECYSTECTOMY  11/10/14   COLONOSCOPY  07/27/2014   [facility]   COLONOSCOPY  2002   [facility]   LAPAROSCOPY  1995? and 2002   TUBAL LIGATION     BTL with bands     Current Outpatient Medications:    Acetaminophen (TYLENOL PO), Take by mouth as needed., Disp: , Rfl:    ALPRAZolam (XANAX) 0.5 MG tablet, Take 0.5 mg by mouth 2 (two) times daily as needed. , Disp: , Rfl:    amphetamine-dextroamphetamine (ADDERALL) 20 MG tablet, Take 1 tablet (20 mg total) by mouth 2 (two) times daily as needed., Disp: 60 tablet, Rfl: 0   cholecalciferol (VITAMIN D) 1000 UNITS tablet, Take 1,000 Units by mouth daily., Disp: , Rfl:    cyclobenzaprine (FLEXERIL) 10 MG tablet, Take 10 mg by mouth at bedtime as needed., Disp: , Rfl:    desvenlafaxine (PRISTIQ) 100 MG 24 hr tablet, Take 1 tablet (100 mg total) by mouth daily., Disp: 30 tablet, Rfl: 2   gabapentin (NEURONTIN) 300 MG capsule, Take by mouth., Disp: , Rfl:    hyoscyamine (LEVSIN) 0.125 MG tablet, Take 1 tablet (0.125 mg total) by mouth every 6 (six) hours as needed., Disp:  30 tablet, Rfl: 0   levothyroxine (SYNTHROID) 25 MCG tablet, TAKE 1 TABLET BY MOUTH DAILY BEFORE BREAKFAST., Disp: 90 tablet, Rfl: 2   loratadine (CLARITIN) 10 MG tablet, Take 10 mg by mouth daily., Disp: , Rfl:    Multiple Vitamins-Minerals (MULTIVITAMIN PO), Take by mouth daily., Disp: , Rfl:    Na Sulfate-K Sulfate-Mg Sulf 17.5-3.13-1.6 GM/177ML SOLN, Take 354 mLs by mouth once for 1 dose., Disp: 354 mL, Rfl: 0   Naproxen Sodium (ALEVE PO), Take by mouth as needed., Disp: , Rfl:    ondansetron (ZOFRAN) 8 MG tablet, Take 1 tablet (8 mg total) by mouth 3 (three) times daily as needed for nausea or vomiting., Disp: 30 tablet, Rfl: 0    zolpidem (AMBIEN) 10 MG tablet, Take 10 mg by mouth Nightly., Disp: , Rfl:    omeprazole (PRILOSEC) 40 MG capsule, Take 1 capsule (40 mg total) by mouth daily before breakfast., Disp: 30 capsule, Rfl: 2   Family History  Problem Relation Age of Onset   Hypertension Mother    Thyroid disease Mother    Thyroid disease Sister    Breast cancer Paternal Grandmother        81s; deceased     Social History   Tobacco Use   Smoking status: Former    Types: Cigarettes    Start date: 07/15/1990    Quit date: 07/16/1991    Years since quitting: 30.6   Smokeless tobacco: Never   Tobacco comments:    age 69 for about 35yr Substance Use Topics   Alcohol use: Yes    Comment: 1 Per month   Drug use: No    Allergies as of 02/20/2022 - Review Complete 02/20/2022  Allergen Reaction Noted   Aspirin  02/22/2013   Bactrim [sulfamethoxazole-trimethoprim] Other (See Comments) 01/24/2022   Latex  02/22/2013    Review of Systems:    All systems reviewed and negative except where noted in HPI.   Physical Exam:  BP 124/84 (BP Location: Left Arm, Patient Position: Sitting, Cuff Size: Normal)   Pulse 80   Temp 98.4 F (36.9 C) (Oral)   Ht '5\' 4"'$  (1.626 m)   Wt 171 lb 8 oz (77.8 kg)   LMP 01/24/2022   BMI 29.44 kg/m  Patient's last menstrual period was 01/24/2022.  General:   Alert,  Well-developed, well-nourished, pleasant and cooperative in NAD Head:  Normocephalic and atraumatic. Eyes:  Sclera clear, no icterus.   Conjunctiva pink. Ears:  Normal auditory acuity. Nose:  No deformity, discharge, or lesions. Mouth:  No deformity or lesions,oropharynx pink & moist. Neck:  Supple; no masses or thyromegaly. Lungs:  Respirations even and unlabored.  Clear throughout to auscultation.   No wheezes, crackles, or rhonchi. No acute distress. Heart:  Regular rate and rhythm; no murmurs, clicks, rubs, or gallops. Abdomen:  Normal bowel sounds. Soft, non-tender and non-distended without masses,  hepatosplenomegaly or hernias noted.  No guarding or rebound tenderness.   Rectal: Not performed Msk:  Symmetrical without gross deformities. Good, equal movement & strength bilaterally. Pulses:  Normal pulses noted. Extremities:  No clubbing or edema.  No cyanosis. Neurologic:  Alert and oriented x3;  grossly normal neurologically. Skin:  Intact without significant lesions or rashes. No jaundice. Psych:  Alert and cooperative. Normal mood and affect.  Imaging Studies: Reviewed  Assessment and Plan:   HGenetta Fierois a 51y.o. female with history of anxiety, depression, long-term NSAID use is seen in consultation for abdominal discomfort and  nausea, CT revealed gastritis and enteritis.  Symptoms have significantly improved but have not completely resolved.  Concern for NSAID induced gastritis or peptic ulcer disease and enteropathy.  Strongly advised patient to stop taking NSAIDs.  Recommend to start omeprazole 40 mg once a day before breakfast Recommend upper endoscopy with gastric and duodenal biopsies Recommend screening colonoscopy with TI evaluation  Follow up as needed   Cephas Darby, MD

## 2022-02-22 ENCOUNTER — Other Ambulatory Visit: Payer: Self-pay | Admitting: Physician Assistant

## 2022-02-22 DIAGNOSIS — R1010 Upper abdominal pain, unspecified: Secondary | ICD-10-CM

## 2022-04-12 ENCOUNTER — Ambulatory Visit: Payer: 59 | Admitting: General Practice

## 2022-04-12 ENCOUNTER — Encounter
Admission: RE | Disposition: A | Discharge status: Home / Self Care | Payer: Self-pay | Source: Home / Self Care | Attending: Gastroenterology

## 2022-04-12 ENCOUNTER — Ambulatory Visit
Admission: RE | Admit: 2022-04-12 | Discharge: 2022-04-12 | Disposition: A | Discharge status: Home / Self Care | Payer: 59 | Attending: Gastroenterology | Admitting: Gastroenterology

## 2022-04-12 ENCOUNTER — Encounter: Payer: Self-pay | Admitting: Gastroenterology

## 2022-04-12 DIAGNOSIS — F988 Other specified behavioral and emotional disorders with onset usually occurring in childhood and adolescence: Secondary | ICD-10-CM | POA: Diagnosis not present

## 2022-04-12 DIAGNOSIS — K2990 Gastroduodenitis, unspecified, without bleeding: Secondary | ICD-10-CM

## 2022-04-12 DIAGNOSIS — F419 Anxiety disorder, unspecified: Secondary | ICD-10-CM | POA: Insufficient documentation

## 2022-04-12 DIAGNOSIS — Z87891 Personal history of nicotine dependence: Secondary | ICD-10-CM | POA: Insufficient documentation

## 2022-04-12 DIAGNOSIS — F32A Depression, unspecified: Secondary | ICD-10-CM | POA: Insufficient documentation

## 2022-04-12 DIAGNOSIS — E039 Hypothyroidism, unspecified: Secondary | ICD-10-CM | POA: Insufficient documentation

## 2022-04-12 DIAGNOSIS — K254 Chronic or unspecified gastric ulcer with hemorrhage: Secondary | ICD-10-CM | POA: Diagnosis not present

## 2022-04-12 DIAGNOSIS — Z1211 Encounter for screening for malignant neoplasm of colon: Secondary | ICD-10-CM | POA: Diagnosis present

## 2022-04-12 DIAGNOSIS — E063 Autoimmune thyroiditis: Secondary | ICD-10-CM | POA: Insufficient documentation

## 2022-04-12 DIAGNOSIS — K2970 Gastritis, unspecified, without bleeding: Secondary | ICD-10-CM | POA: Diagnosis not present

## 2022-04-12 DIAGNOSIS — K529 Noninfective gastroenteritis and colitis, unspecified: Secondary | ICD-10-CM

## 2022-04-12 DIAGNOSIS — K319 Disease of stomach and duodenum, unspecified: Secondary | ICD-10-CM | POA: Insufficient documentation

## 2022-04-12 HISTORY — PX: COLONOSCOPY WITH PROPOFOL: SHX5780

## 2022-04-12 HISTORY — PX: ESOPHAGOGASTRODUODENOSCOPY (EGD) WITH PROPOFOL: SHX5813

## 2022-04-12 LAB — POCT PREGNANCY, URINE: Preg Test, Ur: NEGATIVE

## 2022-04-12 SURGERY — COLONOSCOPY WITH PROPOFOL
Anesthesia: General

## 2022-04-12 MED ORDER — SODIUM CHLORIDE 0.9 % IV SOLN: INTRAVENOUS | Status: DC

## 2022-04-12 MED ORDER — PROPOFOL 500 MG/50ML IV EMUL
INTRAVENOUS | Status: DC | PRN
  Administered 2022-04-12: 150 ug/kg/min via INTRAVENOUS

## 2022-04-12 MED ORDER — PROPOFOL 10 MG/ML IV BOLUS
INTRAVENOUS | Status: DC | PRN
  Administered 2022-04-12: 20 mg via INTRAVENOUS
  Administered 2022-04-12: 80 mg via INTRAVENOUS
  Administered 2022-04-12: 20 mg via INTRAVENOUS

## 2022-04-12 MED ORDER — GLYCOPYRROLATE 0.2 MG/ML IJ SOLN
INTRAMUSCULAR | Status: DC | PRN
  Administered 2022-04-12: .2 mg via INTRAVENOUS

## 2022-04-12 MED ORDER — MIDAZOLAM HCL 2 MG/2ML IJ SOLN
INTRAMUSCULAR | Status: DC | PRN
  Administered 2022-04-12: 2 mg via INTRAVENOUS

## 2022-04-12 MED ORDER — MIDAZOLAM HCL 2 MG/2ML IJ SOLN
INTRAMUSCULAR | Status: AC
  Filled 2022-04-12: qty 2

## 2022-04-12 MED ORDER — LIDOCAINE HCL (CARDIAC) PF 100 MG/5ML IV SOSY
PREFILLED_SYRINGE | INTRAVENOUS | Status: DC | PRN
  Administered 2022-04-12: 100 mg via INTRAVENOUS

## 2022-04-12 NOTE — Transfer of Care (Signed)
Immediate Anesthesia Transfer of Care Note  Patient: Nichole Campos  Procedure(s) Performed: COLONOSCOPY WITH PROPOFOL ESOPHAGOGASTRODUODENOSCOPY (EGD) WITH PROPOFOL  Patient Location: Endoscopy Unit  Anesthesia Type:General  Level of Consciousness: awake, alert  and oriented  Airway & Oxygen Therapy: Patient Spontanous Breathing  Post-op Assessment: Report given to RN and Post -op Vital signs reviewed and stable  Post vital signs: Reviewed and stable  Last Vitals:  Vitals Value Taken Time  BP 115/75   Temp    Pulse 88 04/12/22 1026  Resp 18 04/12/22 1026  SpO2 100 % 04/12/22 1026  Vitals shown include unvalidated device data.  Last Pain:  Vitals:   04/12/22 0836  TempSrc: Temporal  PainSc: 0-No pain         Complications: No notable events documented.

## 2022-04-12 NOTE — Op Note (Signed)
[facility] Gastroenterology Patient Name: Nichole Campos Procedure Date: 04/12/2022 9:50 AM MRN: [id number] Account #: [id number] Date of Birth: Jun 27, 1971 Admit Type: Outpatient Age: 51 Room: [facility] ENDO ROOM 2 Gender: Female Note Status: Finalized Instrument Name: Upper Endoscope [id number] Procedure:             Upper GI endoscopy Indications:           Epigastric abdominal pain, Dyspepsia Providers:             Lin Landsman MD, MD Referring MD:          Lorrene Reid (Referring MD) Medicines:             General Anesthesia Complications:         No immediate complications. Estimated blood loss: None. Procedure:             Pre-Anesthesia Assessment:                        - Prior to the procedure, a History and Physical was                         performed, and patient medications and allergies were                         reviewed. The patient is competent. The risks and                         benefits of the procedure and the sedation options and                         risks were discussed with the patient. All questions                         were answered and informed consent was obtained.                         Patient identification and proposed procedure were                         verified by the physician, the nurse, the                         anesthesiologist, the anesthetist and the technician                         in the pre-procedure area in the procedure room in the                         endoscopy suite. Mental Status Examination: alert and                         oriented. Airway Examination: normal oropharyngeal                         airway and neck mobility. Respiratory Examination:                         clear to auscultation. CV Examination: normal.  Prophylactic Antibiotics: The patient does not require                         prophylactic antibiotics. Prior Anticoagulants: The                          patient has taken no previous anticoagulant or                         antiplatelet agents. ASA Grade Assessment: II - A                         patient with mild systemic disease. After reviewing                         the risks and benefits, the patient was deemed in                         satisfactory condition to undergo the procedure. The                         anesthesia plan was to use general anesthesia.                         Immediately prior to administration of medications,                         the patient was re-assessed for adequacy to receive                         sedatives. The heart rate, respiratory rate, oxygen                         saturations, blood pressure, adequacy of pulmonary                         ventilation, and response to care were monitored                         throughout the procedure. The physical status of the                         patient was re-assessed after the procedure.                        After obtaining informed consent, the endoscope was                         passed under direct vision. Throughout the procedure,                         the patient's blood pressure, pulse, and oxygen                         saturations were monitored continuously. The Endoscope                         was introduced through the mouth, and advanced to the  second part of duodenum. The upper GI endoscopy was                         accomplished without difficulty. The patient tolerated                         the procedure well. Findings:      The duodenal bulb and second portion of the duodenum were normal.       Biopsies were taken with a cold forceps for histology.      Multiple dispersed small erosions with stigmata of recent bleeding were       found in the gastric antrum. Biopsies were taken with a cold forceps for       histology.      The cardia and gastric fundus were normal on retroflexion.      Esophagogastric  landmarks were identified: the gastroesophageal junction       was found at 39 cm from the incisors.      The gastroesophageal junction and examined esophagus were normal. Impression:            - Normal duodenal bulb and second portion of the                         duodenum. Biopsied.                        - Erosive gastropathy with stigmata of recent                         bleeding. Biopsied.                        - Esophagogastric landmarks identified.                        - Normal gastroesophageal junction and esophagus. Recommendation:        - Await pathology results.                        - Proceed with colonoscopy as scheduled                        See colonoscopy report Procedure Code(s):     --- Professional ---                        [phone], Esophagogastroduodenoscopy, flexible,                         transoral; with biopsy, single or multiple Diagnosis Code(s):     --- Professional ---                        K92.2, Gastrointestinal hemorrhage, unspecified                        R10.13, Epigastric pain CPT copyright 2019 American Medical Association. All rights reserved. The codes documented in this report are preliminary and upon coder review may  be revised to meet current compliance requirements. Dr. Ulyess Mort Lin Landsman MD, MD 04/12/2022 10:03:42 AM This report has been signed electronically. Number of Addenda: 0 Note Initiated On:  04/12/2022 9:50 AM Estimated Blood Loss:  Estimated blood loss: none.      [facility]

## 2022-04-12 NOTE — H&P (Signed)
Cephas Darby, MD [address]  [place]  [place], [place] [address]  Main: [phone]  Fax: [phone] Pager: [phone]  Primary Care Physician:  Lorrene Reid, PA-C Primary Gastroenterologist:  Dr. Cephas Darby  Pre-Procedure History & Physical: HPI:  Nichole Campos is a 51 y.o. female is here for an endoscopy and colonoscopy.   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   arm surgery     age 14 rt arm surgery   BREAST BIOPSY Right Jan 2015   BREAST BIOPSY Left 09/2014   BREAST EXCISIONAL BIOPSY     BREAST FIBROADENOMA SURGERY Left    age 4   CHOLECYSTECTOMY  11/10/14   COLONOSCOPY  07/27/2014   [facility]   COLONOSCOPY  2002   [facility]   LAPAROSCOPY  1995? and 2002   TUBAL LIGATION     BTL with bands    Prior to Admission medications   Medication Sig Start Date End Date Taking? Authorizing Provider  cholecalciferol (VITAMIN D) 1000 UNITS tablet Take 1,000 Units by mouth daily.   Yes [provider]  desvenlafaxine (PRISTIQ) 100 MG 24 hr tablet Take 1 tablet (100 mg total) by mouth daily. 08/04/17  Yes Lavera Guise, MD  gabapentin (NEURONTIN) 300 MG capsule Take by mouth. 02/03/22  Yes [provider]  levothyroxine (SYNTHROID) 25 MCG tablet TAKE 1 TABLET BY MOUTH DAILY BEFORE BREAKFAST. 02/05/22  Yes Abonza, Maritza, PA-C  loratadine (CLARITIN) 10 MG tablet Take 10 mg by mouth daily.   Yes [provider]  Multiple Vitamins-Minerals (MULTIVITAMIN PO) Take by mouth daily.   Yes [provider]  Acetaminophen (TYLENOL PO) Take  by mouth as needed.    [provider]  ALPRAZolam Duanne Moron) 0.5 MG tablet Take 0.5 mg by mouth 2 (two) times daily as needed.  05/21/14   [provider]  amphetamine-dextroamphetamine (ADDERALL) 20 MG tablet Take 1 tablet (20 mg total) by mouth 2 (two) times daily as needed. 09/21/18   Kendell Bane, NP  cyclobenzaprine (FLEXERIL) 10 MG tablet Take 10 mg by mouth at bedtime as needed. 12/06/21   [provider]  hyoscyamine (LEVSIN) 0.125 MG tablet Take 1 tablet (0.125 mg total) by mouth every 6 (six) hours as needed. 01/24/22   Lorrene Reid, PA-C  Naproxen Sodium (ALEVE PO) Take by mouth as needed.    [provider]  omeprazole (PRILOSEC) 40 MG capsule Take 1 capsule (40 mg total) by mouth daily before breakfast. 02/20/22 05/21/22  Cianni Manny, Tally Due, MD  ondansetron (ZOFRAN) 8 MG tablet TAKE 1 TABLET (8 MG TOTAL) BY MOUTH 3 (THREE) TIMES DAILY AS NEEDED FOR NAUSEA OR VOMITING. 02/22/22   Abonza, Maritza, PA-C  zolpidem (AMBIEN) 10 MG tablet Take 10 mg by mouth Nightly. 05/21/14   [provider]    Allergies as of 02/20/2022 - Review Complete 02/20/2022  Allergen Reaction Noted   Aspirin  02/22/2013   Bactrim [sulfamethoxazole-trimethoprim] Other (See Comments) 01/24/2022   Latex  02/22/2013  Family History  Problem Relation Age of Onset   Hypertension Mother    Thyroid disease Mother    Thyroid disease Sister    Breast cancer Paternal Grandmother        13s; deceased    Social History   Socioeconomic History   Marital status: Married    Spouse name: Not on file   Number of children: 1   Years of education: Not on file   Highest education level: Not on file  Occupational History    Employer: [employer]  Tobacco Use   Smoking status: Former    Types: Cigarettes    Start date: 07/15/1990    Quit date: 07/16/1991    Years since quitting: 30.7   Smokeless tobacco: Never   Tobacco comments:    age 77 for about 39yr Substance and  Sexual Activity   Alcohol use: Yes    Comment: 1 Per month   Drug use: No   Sexual activity: Yes    Partners: Male    Birth control/protection: Surgical    Comment: BTL  Other Topics Concern   Not on file  Social History Narrative   Not on file   Social Determinants of Health   Financial Resource Strain: Not on file  Food Insecurity: Not on file  Transportation Needs: Not on file  Physical Activity: Not on file  Stress: Not on file  Social Connections: Not on file  Intimate Partner Violence: Not on file    Review of Systems: See HPI, otherwise negative ROS  Physical Exam: BP (!) 125/93   Pulse 71   Temp 97.6 F (36.4 C) (Temporal)   Resp 16   Ht '5\' 5"'$  (1.651 m)   Wt 77.1 kg   SpO2 100%   BMI 28.29 kg/m  General:   Alert,  pleasant and cooperative in NAD Head:  Normocephalic and atraumatic. Neck:  Supple; no masses or thyromegaly. Lungs:  Clear throughout to auscultation.    Heart:  Regular rate and rhythm. Abdomen:  Soft, nontender and nondistended. Normal bowel sounds, without guarding, and without rebound.   Neurologic:  Alert and  oriented x4;  grossly normal neurologically.  Impression/Plan: Nichole Campos here for an endoscopy and colonoscopy to be performed for colon cancer screening, dyspesia  Risks, benefits, limitations, and alternatives regarding  endoscopy and colonoscopy have been reviewed with the patient.  Questions have been answered.  All parties agreeable.   RSherri Sear MD  04/12/2022, 8:51 AM

## 2022-04-12 NOTE — Op Note (Signed)
[facility] Patient Name: Nichole Campos Procedure Date: 04/12/2022 9:49 AM MRN: [id number] Account #: [id number] Date of Birth: [birth year] Admit Type: Outpatient Age: 51 Room: [facility] ENDO ROOM 2 Gender: Female Note Status: Finalized Instrument Name: Park Meo [id number] Procedure:             Colonoscopy Indications:           Screening for colorectal malignant neoplasm Providers:             Lin Landsman MD, MD Referring MD:          Lorrene Reid (Referring MD) Medicines:             General Anesthesia Complications:         No immediate complications. Estimated blood loss: None. Procedure:             Pre-Anesthesia Assessment:                        - Prior to the procedure, a History and Physical was                         performed, and patient medications and allergies were                         reviewed. The patient is competent. The risks and                         benefits of the procedure and the sedation options and                         risks were discussed with the patient. All questions                         were answered and informed consent was obtained.                         Patient identification and proposed procedure were                         verified by the physician, the nurse, the                         anesthesiologist, the anesthetist and the technician                         in the pre-procedure area in the procedure room in the                         endoscopy suite. Mental Status Examination: alert and                         oriented. Airway Examination: normal oropharyngeal                         airway and neck mobility. Respiratory Examination:                         clear to auscultation. CV Examination: normal.  Prophylactic Antibiotics: The patient does not require                         prophylactic antibiotics. Prior Anticoagulants: The                          patient has taken no previous anticoagulant or                         antiplatelet agents. ASA Grade Assessment: II - A                         patient with mild systemic disease. After reviewing                         the risks and benefits, the patient was deemed in                         satisfactory condition to undergo the procedure. The                         anesthesia plan was to use general anesthesia.                         Immediately prior to administration of medications,                         the patient was re-assessed for adequacy to receive                         sedatives. The heart rate, respiratory rate, oxygen                         saturations, blood pressure, adequacy of pulmonary                         ventilation, and response to care were monitored                         throughout the procedure. The physical status of the                         patient was re-assessed after the procedure.                        After obtaining informed consent, the colonoscope was                         passed under direct vision. Throughout the procedure,                         the patient's blood pressure, pulse, and oxygen                         saturations were monitored continuously. The                         Colonoscope was introduced through the anus and  advanced to the the terminal ileum, with                         identification of the appendiceal orifice and IC                         valve. The colonoscopy was performed without                         difficulty. The patient tolerated the procedure well.                         The quality of the bowel preparation was evaluated                         using the BBPS [facility] Bowel Preparation Scale) with                         scores of: Right Colon = 3, Transverse Colon = 3 and                         Left Colon = 3 (entire mucosa seen well with no                          residual staining, small fragments of stool or opaque                         liquid). The total BBPS score equals 9. Findings:      The perianal and digital rectal examinations were normal. Pertinent       negatives include normal sphincter tone and no palpable rectal lesions.      The terminal ileum appeared normal.      The entire examined colon appeared normal.      The retroflexed view of the distal rectum and anal verge was normal and       showed no anal or rectal abnormalities. Impression:            - The examined portion of the ileum was normal.                        - The entire examined colon is normal.                        - The distal rectum and anal verge are normal on                         retroflexion view.                        - No specimens collected. Recommendation:        - Discharge patient to home (with escort).                        - Resume previous diet today.                        - Continue present medications.                        -  Repeat colonoscopy in 10 years for screening                         purposes. Procedure Code(s):     --- Professional ---                        X4503, Colorectal cancer screening; colonoscopy on                         individual not meeting criteria for high risk Diagnosis Code(s):     --- Professional ---                        Z12.11, Encounter for screening for malignant neoplasm                         of colon CPT copyright 2019 American Medical Association. All rights reserved. The codes documented in this report are preliminary and upon coder review may  be revised to meet current compliance requirements. Dr. Ulyess Mort Lin Landsman MD, MD 04/12/2022 10:23:16 AM This report has been signed electronically. Number of Addenda: 0 Note Initiated On: 04/12/2022 9:49 AM Scope Withdrawal Time: 0 hours 11 minutes 1 second  Total Procedure Duration: 0 hours 16 minutes 9 seconds  Estimated Blood Loss:   Estimated blood loss: none.      [facility]

## 2022-04-12 NOTE — Anesthesia Postprocedure Evaluation (Signed)
Anesthesia Post Note  Patient: Nichole Campos  Procedure(s) Performed: COLONOSCOPY WITH PROPOFOL ESOPHAGOGASTRODUODENOSCOPY (EGD) WITH PROPOFOL  Patient location during evaluation: Endoscopy Anesthesia Type: General Level of consciousness: awake and alert Pain management: pain level controlled Vital Signs Assessment: post-procedure vital signs reviewed and stable Respiratory status: spontaneous breathing, nonlabored ventilation, respiratory function stable and patient connected to nasal cannula oxygen Cardiovascular status: blood pressure returned to baseline and stable Postop Assessment: no apparent nausea or vomiting Anesthetic complications: no   No notable events documented.   Last Vitals:  Vitals:   04/12/22 1036 04/12/22 1046  BP: (!) 124/91 118/85  Pulse:    Resp: 19 15  Temp:    SpO2:      Last Pain:  Vitals:   04/12/22 1046  TempSrc:   PainSc: 0-No pain                 Dimas Millin

## 2022-04-12 NOTE — Anesthesia Preprocedure Evaluation (Signed)
Anesthesia Evaluation  Patient identified by MRN, date of birth, ID band Patient awake    Reviewed: Allergy & Precautions, NPO status , Patient's Chart, lab work & pertinent test results  History of Anesthesia Complications Negative for: history of anesthetic complications  Airway Mallampati: II  TM Distance: >3 FB Neck ROM: full    Dental  (+) Dental Advidsory Given   Pulmonary neg pulmonary ROS, neg shortness of breath, former smoker,    Pulmonary exam normal        Cardiovascular (-) anginanegative cardio ROS Normal cardiovascular exam     Neuro/Psych PSYCHIATRIC DISORDERS negative neurological ROS     GI/Hepatic negative GI ROS, Neg liver ROS,   Endo/Other  Hypothyroidism   Renal/GU negative Renal ROS  negative genitourinary   Musculoskeletal   Abdominal   Peds  Hematology negative hematology ROS (+)   Anesthesia Other Findings Past Medical History: No date: ADD (attention deficit disorder) No date: Anxiety No date: Arthritis No date: Depression No date: Family history of malignant neoplasm of breast No date: Hashimoto's disease No date: Migraine with aura     Comment:  as a teenager No date: Other specified disorders of thyroid     Comment:  increased thyroid antibodies No date: STD (sexually transmitted disease)     Comment:  HSV1  Past Surgical History: 1985?: APPENDECTOMY No date: arm surgery     Comment:  age 28 rt arm surgery Jan 2015: BREAST BIOPSY; Right 09/2014: BREAST BIOPSY; Left No date: BREAST EXCISIONAL BIOPSY No date: BREAST FIBROADENOMA SURGERY; Left     Comment:  age 9 11/10/14: CHOLECYSTECTOMY 07/27/2014: COLONOSCOPY     Comment:  [facility] 2002: COLONOSCOPY     Comment:  [facility] 1995? and 2002: LAPAROSCOPY No date: TUBAL LIGATION     Comment:  BTL with bands  BMI    Body Mass Index: 28.29 kg/m      Reproductive/Obstetrics negative OB ROS                              Anesthesia Physical Anesthesia Plan  ASA: 2  Anesthesia Plan: General   Post-op Pain Management: Minimal or no pain anticipated   Induction: Intravenous  PONV Risk Score and Plan: 3 and Propofol infusion, TIVA and Ondansetron  Airway Management Planned: Nasal Cannula  Additional Equipment: None  Intra-op Plan:   Post-operative Plan:   Informed Consent: I have reviewed the patients History and Physical, chart, labs and discussed the procedure including the risks, benefits and alternatives for the proposed anesthesia with the patient or authorized representative who has indicated his/her understanding and acceptance.     Dental advisory given  Plan Discussed with: CRNA and Surgeon  Anesthesia Plan Comments: (Discussed risks of anesthesia with patient, including possibility of difficulty with spontaneous ventilation under anesthesia necessitating airway intervention, PONV, and rare risks such as cardiac or respiratory or neurological events, and allergic reactions. Discussed the role of CRNA in patient's perioperative care. Patient understands.)        Anesthesia Quick Evaluation

## 2022-04-12 NOTE — Anesthesia Procedure Notes (Signed)
Date/Time: 04/12/2022 10:00 AM  Performed by: Lily Peer, Antonios Ostrow, CRNAPre-anesthesia Checklist: Patient identified, Emergency Drugs available, Suction available, Patient being monitored and Timeout performed Patient Re-evaluated:Patient Re-evaluated prior to induction Oxygen Delivery Method: Simple face mask Induction Type: IV induction

## 2022-04-15 ENCOUNTER — Encounter: Payer: Self-pay | Admitting: Gastroenterology

## 2022-04-15 LAB — SURGICAL PATHOLOGY

## 2022-05-21 ENCOUNTER — Other Ambulatory Visit: Payer: Self-pay | Admitting: General Surgery

## 2022-05-23 LAB — SURGICAL PATHOLOGY

## 2022-11-05 ENCOUNTER — Other Ambulatory Visit: Payer: Self-pay | Admitting: Family Medicine

## 2022-11-05 DIAGNOSIS — G4709 Other insomnia: Secondary | ICD-10-CM

## 2022-11-05 DIAGNOSIS — R413 Other amnesia: Secondary | ICD-10-CM

## 2022-11-05 DIAGNOSIS — R232 Flushing: Secondary | ICD-10-CM

## 2022-11-05 DIAGNOSIS — R4189 Other symptoms and signs involving cognitive functions and awareness: Secondary | ICD-10-CM

## 2022-11-05 DIAGNOSIS — N951 Menopausal and female climacteric states: Secondary | ICD-10-CM

## 2022-11-05 NOTE — Progress Notes (Signed)
Patient currently perimenopausal, symptoms have been particularly bothersome recently. They are interfering with personal and professional life and she would like to find a solution.  Per patient chat: "I feel like I am very symptomatic with perimenopause. I have hashimoto's hypothyroid and some pretty impressive thyroid nodules. hot flashes, but most bothersome to me is brain fog, insomnia, memory issues. I have been awake since like 2:50 am. ugh. my last cycle started 09/20/2022. before that, it was 01/18/2022. I'm sorry to bombard you with this. I think, maybe, I need some blood work checked."

## 2022-11-06 ENCOUNTER — Other Ambulatory Visit: Payer: 59

## 2022-11-06 DIAGNOSIS — N951 Menopausal and female climacteric states: Secondary | ICD-10-CM

## 2022-11-06 DIAGNOSIS — R232 Flushing: Secondary | ICD-10-CM

## 2022-11-06 DIAGNOSIS — R4189 Other symptoms and signs involving cognitive functions and awareness: Secondary | ICD-10-CM

## 2022-11-06 DIAGNOSIS — G4709 Other insomnia: Secondary | ICD-10-CM

## 2022-11-06 DIAGNOSIS — R413 Other amnesia: Secondary | ICD-10-CM

## 2022-11-07 LAB — CORTISOL, FREE

## 2022-11-20 LAB — COMPREHENSIVE METABOLIC PANEL
ALT: 19 IU/L (ref 0–32)
AST: 21 IU/L (ref 0–40)
Albumin/Globulin Ratio: 1.8 (ref 1.2–2.2)
Albumin: 4.7 g/dL (ref 3.9–4.9)
Alkaline Phosphatase: 79 IU/L (ref 44–121)
BUN/Creatinine Ratio: 15 (ref 9–23)
BUN: 13 mg/dL (ref 6–24)
Bilirubin Total: <0.2 mg/dL (ref 0.0–1.2)
CO2: 22 mmol/L (ref 20–29)
Calcium: 9.7 mg/dL (ref 8.7–10.2)
Chloride: 103 mmol/L (ref 96–106)
Creatinine, Ser: 0.87 mg/dL (ref 0.57–1.00)
Globulin, Total: 2.6 g/dL (ref 1.5–4.5)
Glucose: 87 mg/dL (ref 70–99)
Potassium: 4.5 mmol/L (ref 3.5–5.2)
Sodium: 140 mmol/L (ref 134–144)
Total Protein: 7.3 g/dL (ref 6.0–8.5)
eGFR: 82 mL/min/{1.73_m2} (ref >59)

## 2022-11-20 LAB — HORMONE PANEL (T4,TSH,FSH,TESTT,SHBG,DHEA,ETC)
DHEA-Sulfate, LCMS: 13 ug/dL
Estradiol, Serum, MS: 11 pg/mL
Estrone Sulfate: 26 ng/dL
Follicle Stimulating Hormone: 108 m[IU]/mL
Free T-3: 2.9 pg/mL
Free Testosterone, Serum: 0.7 pg/mL — ABNORMAL LOW
Progesterone, Serum: <10 ng/dL
Sex Hormone Binding Globulin: 31.8 nmol/L
T4: 7.9 ug/dL
TSH: 0.64 uU/mL
Testosterone, Serum (Total): 5.4 ng/dL
Testosterone-% Free: 1.3 %
Triiodothyronine (T-3), Serum: 105 ng/dL

## 2022-11-20 LAB — CBC WITH DIFFERENTIAL/PLATELET
Basophils Absolute: 0 10*3/uL (ref 0.0–0.2)
Basos: 1 %
EOS (ABSOLUTE): 0 10*3/uL (ref 0.0–0.4)
Eos: 1 %
Hematocrit: 40.9 % (ref 34.0–46.6)
Hemoglobin: 13.7 g/dL (ref 11.1–15.9)
Immature Grans (Abs): 0 10*3/uL (ref 0.0–0.1)
Immature Granulocytes: 0 %
Lymphocytes Absolute: 1.5 10*3/uL (ref 0.7–3.1)
Lymphs: 34 %
MCH: 30.1 pg (ref 26.6–33.0)
MCHC: 33.5 g/dL (ref 31.5–35.7)
MCV: 90 fL (ref 79–97)
Monocytes Absolute: 0.5 10*3/uL (ref 0.1–0.9)
Monocytes: 10 %
Neutrophils Absolute: 2.3 10*3/uL (ref 1.4–7.0)
Neutrophils: 54 %
Platelets: 284 10*3/uL (ref 150–450)
RBC: 4.55 x10E6/uL (ref 3.77–5.28)
RDW: 13.3 % (ref 11.7–15.4)
WBC: 4.4 10*3/uL (ref 3.4–10.8)

## 2022-11-20 LAB — T3, FREE: T3, Free: 3.1 pg/mL (ref 2.0–4.4)

## 2022-11-20 LAB — HEMOGLOBIN A1C
Est. average glucose Bld gHb Est-mCnc: 111 mg/dL
Hgb A1c MFr Bld: 5.5 % (ref 4.8–5.6)

## 2022-11-20 LAB — T4, FREE: Free T4: 1.28 ng/dL (ref 0.82–1.77)

## 2022-11-20 LAB — TSH: TSH: 0.625 u[IU]/mL (ref 0.450–4.500)

## 2022-11-20 LAB — VITAMIN D 25 HYDROXY (VIT D DEFICIENCY, FRACTURES): Vit D, 25-Hydroxy: 43 ng/mL (ref 30.0–100.0)

## 2022-11-25 ENCOUNTER — Other Ambulatory Visit: Payer: Self-pay | Admitting: Family Medicine

## 2022-11-25 DIAGNOSIS — N951 Menopausal and female climacteric states: Secondary | ICD-10-CM

## 2022-11-25 MED ORDER — ESTRADIOL 0.025 MG/24HR TD PTTW
1.0000 | MEDICATED_PATCH | TRANSDERMAL | 12 refills | Status: DC
Start: 2022-11-25 — End: 2023-01-08

## 2022-11-25 MED ORDER — MEDROXYPROGESTERONE ACETATE 2.5 MG PO TABS
2.5000 mg | ORAL_TABLET | Freq: Every day | ORAL | 3 refills | Status: DC
Start: 2022-11-25 — End: 2023-11-24

## 2022-11-25 NOTE — Progress Notes (Signed)
Patient reviewed lab results, requesting Vivelle dot 0.025 mg daily transdermal patch changed twice weekly plus p.o. medroxyprogesterone 2.5 mg daily.

## 2022-12-02 ENCOUNTER — Other Ambulatory Visit: Payer: Self-pay | Admitting: Family Medicine

## 2022-12-02 DIAGNOSIS — E039 Hypothyroidism, unspecified: Secondary | ICD-10-CM

## 2022-12-02 MED ORDER — LEVOTHYROXINE SODIUM 25 MCG PO TABS
25.0000 ug | ORAL_TABLET | Freq: Every day | ORAL | 2 refills | Status: DC
Start: 2022-12-02 — End: 2023-08-27

## 2023-01-08 ENCOUNTER — Ambulatory Visit (INDEPENDENT_AMBULATORY_CARE_PROVIDER_SITE_OTHER): Payer: 59 | Admitting: Family Medicine

## 2023-01-08 ENCOUNTER — Encounter: Payer: Self-pay | Admitting: Family Medicine

## 2023-01-08 VITALS — BP 135/92 | HR 71 | Ht 65.0 in

## 2023-01-08 DIAGNOSIS — N951 Menopausal and female climacteric states: Secondary | ICD-10-CM | POA: Diagnosis not present

## 2023-01-08 DIAGNOSIS — M5126 Other intervertebral disc displacement, lumbar region: Secondary | ICD-10-CM

## 2023-01-08 MED ORDER — GABAPENTIN 300 MG PO CAPS
600.0000 mg | ORAL_CAPSULE | Freq: Four times a day (QID) | ORAL | 2 refills | Status: DC
Start: 2023-01-08 — End: 2023-05-12

## 2023-01-08 MED ORDER — ESTRADIOL 0.0375 MG/24HR TD PTTW
1.0000 | MEDICATED_PATCH | TRANSDERMAL | 12 refills | Status: DC
Start: 2023-01-09 — End: 2023-11-18

## 2023-01-08 NOTE — Assessment & Plan Note (Signed)
Continue gabapentin 600 mg 3-4 times daily.

## 2023-01-08 NOTE — Assessment & Plan Note (Signed)
Increase estradiol patch to 0.0 375 mg per 24 hours.  Continue medroxyprogesterone 1.25 mg daily.  Will continue to monitor.

## 2023-01-08 NOTE — Progress Notes (Signed)
   Established Patient Office Visit  Subjective   Patient ID: Nichole Campos, female    DOB: 12-09-1970  Age: 52 y.o. MRN: [id number]  No chief complaint on file.   HPI Nichole Campos is a 52 y.o. female presenting today for follow up of perimenopausal symptoms and medication refills.  Since starting the estradiol patch and medroxyprogesterone tablet, brain fog, insomnia, memory issues, hot flashes have generally improved some.  There is still some room for improvement, so she would like to consider increasing the estradiol dose.  She started taking half of the medroxyprogesterone tablet because she started spotting, but taking half dose has stopped since.  She is also requesting refill of gabapentin for lumbar radiculopathy from herniated disc.  ROS Negative unless otherwise noted in HPI   Objective:     BP (!) 135/92   Pulse 71   Ht 5\' 5"  (1.651 m)   SpO2 99%   BMI 28.29 kg/m   Physical Exam Constitutional:      General: She is not in acute distress.    Appearance: Normal appearance.  HENT:     Head: Normocephalic and atraumatic.  Cardiovascular:     Rate and Rhythm: Normal rate and regular rhythm.     Heart sounds: No murmur heard.    No friction rub. No gallop.  Pulmonary:     Effort: Pulmonary effort is normal. No respiratory distress.     Breath sounds: No wheezing, rhonchi or rales.  Musculoskeletal:     Cervical back: Normal range of motion.  Skin:    General: Skin is warm and dry.  Neurological:     General: No focal deficit present.     Mental Status: She is alert and oriented to person, place, and time. Mental status is at baseline.  Psychiatric:        Mood and Affect: Mood normal.        Thought Content: Thought content normal.        Judgment: Judgment normal.      Assessment & Plan:  Perimenopausal symptoms Assessment & Plan: Increase estradiol patch to 0.0 375 mg per 24 hours.  Continue medroxyprogesterone 1.25 mg daily.  Will continue to  monitor.  Orders: -     Estradiol; Place 1 patch onto the skin 2 (two) times a week.  Dispense: 8 patch; Refill: 12  Lumbar herniated disc Assessment & Plan: Continue gabapentin 600 mg 3-4 times daily.  Orders: -     Gabapentin; Take 2 capsules (600 mg total) by mouth 4 (four) times daily.  Dispense: 240 capsule; Refill: 2    Return in about 6 months (around 07/10/2023) for annual physical, fasting blood work 1 week before.    Melida Quitter, PA

## 2023-02-05 ENCOUNTER — Other Ambulatory Visit: Payer: Self-pay | Admitting: Family Medicine

## 2023-02-05 DIAGNOSIS — Z1231 Encounter for screening mammogram for malignant neoplasm of breast: Secondary | ICD-10-CM

## 2023-03-10 ENCOUNTER — Other Ambulatory Visit: Payer: Self-pay | Admitting: Family Medicine

## 2023-03-10 DIAGNOSIS — G43109 Migraine with aura, not intractable, without status migrainosus: Secondary | ICD-10-CM

## 2023-03-10 MED ORDER — NURTEC 75 MG PO TBDP
1.0000 | ORAL_TABLET | ORAL | 11 refills | Status: AC
Start: 2023-03-10 — End: ?

## 2023-03-10 NOTE — Progress Notes (Signed)
Patient has previously tried and failed Imitrex injections and tablets, Relpax, Maxalt, Excedrin Migraine.  All triptan medications have caused intolerable side effects.

## 2023-03-27 LAB — HM MAMMOGRAPHY

## 2023-03-31 ENCOUNTER — Encounter: Payer: Self-pay | Admitting: Family Medicine

## 2023-05-12 ENCOUNTER — Other Ambulatory Visit: Payer: Self-pay | Admitting: Family Medicine

## 2023-05-12 DIAGNOSIS — M5126 Other intervertebral disc displacement, lumbar region: Secondary | ICD-10-CM

## 2023-05-12 MED ORDER — GABAPENTIN 300 MG PO CAPS: 600.0000 mg | ORAL_CAPSULE | Freq: Four times a day (QID) | ORAL | 2 refills | Status: DC

## 2023-08-21 ENCOUNTER — Encounter: Payer: Self-pay | Admitting: Family Medicine

## 2023-08-27 ENCOUNTER — Other Ambulatory Visit: Payer: Self-pay | Admitting: Family Medicine

## 2023-08-27 DIAGNOSIS — M5126 Other intervertebral disc displacement, lumbar region: Secondary | ICD-10-CM

## 2023-08-27 DIAGNOSIS — E039 Hypothyroidism, unspecified: Secondary | ICD-10-CM

## 2023-09-12 ENCOUNTER — Telehealth (INDEPENDENT_AMBULATORY_CARE_PROVIDER_SITE_OTHER): Payer: 59 | Admitting: Family Medicine

## 2023-09-12 ENCOUNTER — Encounter: Payer: Self-pay | Admitting: Family Medicine

## 2023-09-12 DIAGNOSIS — R42 Dizziness and giddiness: Secondary | ICD-10-CM

## 2023-09-12 MED ORDER — PREDNISONE 10 MG PO TABS: ORAL_TABLET | ORAL | 0 refills | Status: AC

## 2023-09-12 NOTE — Progress Notes (Signed)
   Established Patient Office Visit  Subjective   Patient ID: Nichole Campos, female    DOB: 03-Nov-1970  Age: 53 y.o. MRN: [id number]  No chief complaint on file.   HPI Patient location: Work Provider location: [facility] primary care Method of visit: Video Duration: 22 minutes.  Medications: Xanax, Adderall, does venlafaxine, gabapentin, Synthroid Nurtec, zolpidem  Vertigo-patient complaining of 2 instances of vertigo.  First episode happened 3 weeks ago and lasted the entire day into the next day.  Next episode started yesterday.  This 1 was more episodic with multiple episodes that lasted up to 30 minutes.  Describes it as room spinning and feeling like she was "drunk".  No decreased hearing or ear pain but feels like she is more sensitive to loud noises.  Has some nausea but no vomiting.  The episodes yesterday were triggered or worsened by positional changes or head movements.  The initial instance was more continuous.  She has not had any vertigo today.  She has taken an over-the-counter antihistamine and Zofran for nausea.  Does not feel that Zofran helped but feels like the antihistamine helped.  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  She would like her medication sent to the [facility] on [address]   The ASCVD Risk score (Arnett DK, et al., 2019) failed to calculate for the following reasons:   Cannot find a previous HDL lab   Cannot find a previous total cholesterol lab  Health Maintenance Due  Topic Date Due   INFLUENZA VACCINE  02/13/2023   COVID-19 Vaccine (1 - 2024-25 season) Never done   Cervical Cancer Screening (HPV/Pap Cotest)  04/02/2023   Zoster Vaccines- Shingrix (1 of 2) Never done      Objective:     There were no vitals taken for this visit.   Physical Exam General: Alert, oriented. HEENT: No facial droop Neuro: Unable  to examine via telemedicine.  Unable to assess if nystagmus is present but patient not complaining of dizziness at the moment.  Speech is normal, no slurring dysarthria or aphasia.   No results found for any visits on 09/12/23.      Assessment & Plan:   Vertigo Assessment & Plan: Initial episode occurred 3 weeks ago, was continuous, lasted greater than 1 day and resolved spontaneously.  Second episode occurred yesterday was more episodic and triggered by movement.  No symptoms today.  Would favor BPPV but given that the 2 instances were different in their presentation, should consider it in the naris, vestibular neuritis, or atypical migraine (has history of migraine).  Provided patient information on Dix-Hallpike maneuver.  Advised her if this does not work she can use the steroid prescription I am sending in.  Minimal concern for CVA but advised patient that it her vertigo returns and is continuous and does not resolve within the first 30 minutes she should go to the emergency department.  Patient to continue her over-the-counter antihistamine as needed      No follow-ups on file.    Sandre Kitty, MD

## 2023-09-12 NOTE — Assessment & Plan Note (Addendum)
 Initial episode occurred 3 weeks ago, was continuous, lasted greater than 1 day and resolved spontaneously.  Second episode occurred yesterday was more episodic and triggered by movement.  No symptoms today.  Would favor BPPV but given that the 2 instances were different in their presentation, should consider it in the naris, vestibular neuritis, or atypical migraine (has history of migraine).  Provided patient information on Dix-Hallpike maneuver.  Advised her if this does not work she can use the steroid prescription I am sending in.  Minimal concern for CVA but advised patient that it her vertigo returns and is continuous and does not resolve within the first 30 minutes she should go to the emergency department.  Patient to continue her over-the-counter antihistamine as needed

## 2023-09-25 ENCOUNTER — Other Ambulatory Visit: Payer: Self-pay | Admitting: Family Medicine

## 2023-09-25 DIAGNOSIS — R42 Dizziness and giddiness: Secondary | ICD-10-CM

## 2023-09-26 ENCOUNTER — Inpatient Hospital Stay: Attending: Hematology

## 2023-09-26 ENCOUNTER — Other Ambulatory Visit: Payer: Self-pay

## 2023-09-26 DIAGNOSIS — E039 Hypothyroidism, unspecified: Secondary | ICD-10-CM

## 2023-09-26 DIAGNOSIS — Z803 Family history of malignant neoplasm of breast: Secondary | ICD-10-CM | POA: Insufficient documentation

## 2023-09-26 DIAGNOSIS — N951 Menopausal and female climacteric states: Secondary | ICD-10-CM

## 2023-09-26 DIAGNOSIS — R42 Dizziness and giddiness: Secondary | ICD-10-CM

## 2023-09-26 DIAGNOSIS — G43109 Migraine with aura, not intractable, without status migrainosus: Secondary | ICD-10-CM

## 2023-09-26 LAB — CMP (CANCER CENTER ONLY)
ALT: 15 U/L (ref 0–44)
AST: 16 U/L (ref 15–41)
Albumin: 4.7 g/dL (ref 3.5–5.0)
Alkaline Phosphatase: 63 U/L (ref 38–126)
Anion gap: 4 — ABNORMAL LOW (ref 5–15)
BUN: 17 mg/dL (ref 6–20)
CO2: 29 mmol/L (ref 22–32)
Calcium: 9.3 mg/dL (ref 8.9–10.3)
Chloride: 105 mmol/L (ref 98–111)
Creatinine: 0.72 mg/dL (ref 0.44–1.00)
GFR, Estimated: >60 mL/min (ref >60)
Glucose, Bld: 93 mg/dL (ref 70–99)
Potassium: 4.3 mmol/L (ref 3.5–5.1)
Sodium: 138 mmol/L (ref 135–145)
Total Bilirubin: 0.3 mg/dL (ref 0.0–1.2)
Total Protein: 7.8 g/dL (ref 6.5–8.1)

## 2023-09-26 LAB — CBC WITH DIFFERENTIAL (CANCER CENTER ONLY)
Abs Immature Granulocytes: 0.05 10*3/uL (ref 0.00–0.07)
Basophils Absolute: 0 10*3/uL (ref 0.0–0.1)
Basophils Relative: 0 %
Eosinophils Absolute: 0.1 10*3/uL (ref 0.0–0.5)
Eosinophils Relative: 2 %
HCT: 42.2 % (ref 36.0–46.0)
Hemoglobin: 13.7 g/dL (ref 12.0–15.0)
Immature Granulocytes: 1 %
Lymphocytes Relative: 16 %
Lymphs Abs: 1 10*3/uL (ref 0.7–4.0)
MCH: 31 pg (ref 26.0–34.0)
MCHC: 32.5 g/dL (ref 30.0–36.0)
MCV: 95.5 fL (ref 80.0–100.0)
Monocytes Absolute: 0.2 10*3/uL (ref 0.1–1.0)
Monocytes Relative: 4 %
Neutro Abs: 4.9 10*3/uL (ref 1.7–7.7)
Neutrophils Relative %: 77 %
Platelet Count: 278 10*3/uL (ref 150–400)
RBC: 4.42 MIL/uL (ref 3.87–5.11)
RDW: 13.8 % (ref 11.5–15.5)
WBC Count: 6.3 10*3/uL (ref 4.0–10.5)
nRBC: 0 % (ref 0.0–0.2)

## 2023-09-26 LAB — T4, FREE: Free T4: 0.83 ng/dL (ref 0.61–1.12)

## 2023-09-26 LAB — LIPID PANEL
Cholesterol: 227 mg/dL — ABNORMAL HIGH (ref 0–200)
HDL: 77 mg/dL (ref >40)
LDL Cholesterol: 135 mg/dL — ABNORMAL HIGH (ref 0–99)
Total CHOL/HDL Ratio: 2.9 ratio
Triglycerides: 75 mg/dL (ref <150)
VLDL: 15 mg/dL (ref 0–40)

## 2023-09-26 LAB — HEMOGLOBIN A1C
Hgb A1c MFr Bld: 5.2 % (ref 4.8–5.6)
Mean Plasma Glucose: 102.54 mg/dL

## 2023-09-26 LAB — SEDIMENTATION RATE: Sed Rate: 7 mm/h (ref 0–22)

## 2023-09-26 LAB — TSH: TSH: 0.092 u[IU]/mL — ABNORMAL LOW (ref 0.350–4.500)

## 2023-09-27 LAB — ESTRADIOL: Estradiol: 26.4 pg/mL

## 2023-10-01 LAB — ESTRADIOL, ULTRA SENS: Estradiol, Sensitive: 22.1 pg/mL

## 2023-10-04 ENCOUNTER — Ambulatory Visit (HOSPITAL_COMMUNITY)
Admission: RE | Admit: 2023-10-04 | Discharge: 2023-10-04 | Disposition: A | Discharge status: Home / Self Care | Source: Ambulatory Visit | Attending: Family Medicine | Admitting: Family Medicine

## 2023-10-04 DIAGNOSIS — R42 Dizziness and giddiness: Secondary | ICD-10-CM | POA: Diagnosis present

## 2023-10-04 MED ORDER — GADOBUTROL 1 MMOL/ML IV SOLN
7.0000 mL | Freq: Once | INTRAVENOUS | Status: AC | PRN
  Administered 2023-10-04: 7 mL via INTRAVENOUS

## 2023-10-10 ENCOUNTER — Other Ambulatory Visit: Payer: Self-pay | Admitting: Family Medicine

## 2023-10-20 ENCOUNTER — Encounter: Payer: Self-pay | Admitting: Family Medicine

## 2023-10-20 ENCOUNTER — Telehealth: Payer: Self-pay

## 2023-10-20 NOTE — Telephone Encounter (Signed)
 Called Radiology spoke with [facility] she stated that she will put it in  for stat to have it read by tomorrow

## 2023-10-28 ENCOUNTER — Telehealth: Admitting: Family Medicine

## 2023-11-18 ENCOUNTER — Encounter: Payer: Self-pay | Admitting: Family Medicine

## 2023-11-18 ENCOUNTER — Ambulatory Visit (INDEPENDENT_AMBULATORY_CARE_PROVIDER_SITE_OTHER): Admitting: Family Medicine

## 2023-11-18 VITALS — BP 137/91 | HR 85 | Ht 65.0 in | Wt 174.0 lb

## 2023-11-18 DIAGNOSIS — H4722 Hereditary optic atrophy: Secondary | ICD-10-CM

## 2023-11-18 DIAGNOSIS — N951 Menopausal and female climacteric states: Secondary | ICD-10-CM

## 2023-11-18 MED ORDER — ESTRADIOL 0.075 MG/24HR TD PTWK: 0.0750 mg | MEDICATED_PATCH | TRANSDERMAL | 12 refills | Status: DC

## 2023-11-18 NOTE — Progress Notes (Signed)
   Established Patient Office Visit  Subjective   Patient ID: Nichole  Campos, female    DOB: [birth year]  Age: 53 y.o. MRN: [id number]  Chief Complaint  Patient presents with   Refferral Consult    Neurologist    HPI  Subjective - Vertigo: No episodes since completing prednisone  course. Reports gradual improvement with treatment. - Balance issues: Intermittent, primarily when turning around or moving from sitting to standing. Always veers to left side. Occurs approximately weekly. No falls but occasional wall bumping. No dizziness or blood pressure drops.  - Hormone replacement therapy: Requesting increase in estradiol  patch dose due to hot flashes. Currently on 0.05 mg patch for just over one year. Started at 0.0375 mg dose. 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 Previous ophthalmologist indicated no treatment available. - Back pain: History of herniated disc diagnosed 11/15/2020. No current complaints beyond occasional balance issues possibly related to left leg.  Medications Estradiol  patch 0.05 mg, Provera  (progesterone), Synthroid  25 mcg, Gabapentin  600 mg three times daily for back pain since 2022, psychiatric medications managed by Dr. Garnet Just  PMH, PSH, FH, Social Hx PMH: Autosomal dominant optic atrophy, herniated disc (11/2020), hypothyroidism, menopausal symptoms, anxiety PSH: Multiple epidural steroid injections for herniated disc FH: Not discussed Social Hx: Not discussed    The 10-year ASCVD risk score (Arnett DK, et al., 2019) is: 1.1%  Health Maintenance Due  Topic Date Due   COVID-19 Vaccine (1 - 2024-25 season) Never done   Cervical Cancer Screening (HPV/Pap Cotest)  04/02/2023   Zoster Vaccines- Shingrix (1 of 2) Never done      Objective:     BP (!) 137/91   Pulse 85   Ht 5\' 5"  (1.651 m)   Wt 174 lb (78.9 kg)   SpO2 98%   BMI 28.96 kg/m    Physical Exam Gen: alert, oriented Cv: rrr Pulm: lctab Neuro: cranial nerves grossly  intact.  Normal gait. Normal heel to toe walking. Slight imbalance with one legged balancing, left > right   No results found for any visits on 11/18/23.      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  Mother and brother both with the same condition.  Pt last saw specialist in 2020, Dr. Constantino Demark.  Did not have any f/u scheduled.  Pt recently had episode of vertigo and continue to have issues with balance.  Pt requested referral to Dr. Moira Andrews, [facility], Neuro ophthalmology.  Pt is aware he is trained in pediatric neuro. She was recommended this provider by another neurologist.    Orders: -     Ambulatory referral to Neurology  Perimenopausal symptoms Assessment & Plan: Increasing estradiol  patch to 0.075mg  from 0.05mg . continue provera  2.5mg    Other orders -     Estradiol ; Place 1 patch (0.075 mg total) onto the skin once a week.  Dispense: 4 patch; Refill: 12     Return in about 6 months (around 05/20/2024) for physical.    Nichole Pintos, MD

## 2023-11-18 NOTE — Patient Instructions (Addendum)
 It was nice to see you today,  We addressed the following topics today: -I will send in a referral to the neuro-ophthalmologist. - I am increasing the estradiol  patch  Have a great day,  Nichole Henle, MD

## 2023-11-22 ENCOUNTER — Other Ambulatory Visit: Payer: Self-pay | Admitting: Family Medicine

## 2023-11-22 DIAGNOSIS — N951 Menopausal and female climacteric states: Secondary | ICD-10-CM

## 2023-11-22 DIAGNOSIS — E039 Hypothyroidism, unspecified: Secondary | ICD-10-CM

## 2023-11-24 NOTE — Assessment & Plan Note (Signed)
 Diagnosed in childhood.  Mother and brother both with the same condition.  Pt last saw specialist in 2020, Dr. Constantino Demark.  Did not have any f/u scheduled.  Pt recently had episode of vertigo and continue to have issues with balance.  Pt requested referral to Dr. Moira Andrews, [facility], Neuro ophthalmology.  Pt is aware he is trained in pediatric neuro. She was recommended this provider by another neurologist.

## 2023-11-24 NOTE — Assessment & Plan Note (Signed)
 Increasing estradiol  patch to 0.075mg  from 0.05mg . continue provera  2.5mg 

## 2023-12-02 ENCOUNTER — Other Ambulatory Visit: Payer: Self-pay | Admitting: Family Medicine

## 2023-12-02 DIAGNOSIS — M5126 Other intervertebral disc displacement, lumbar region: Secondary | ICD-10-CM

## 2023-12-02 MED ORDER — GABAPENTIN 300 MG PO CAPS
600.0000 mg | ORAL_CAPSULE | Freq: Four times a day (QID) | ORAL | 1 refills | Status: DC
Start: 2023-12-02 — End: 2024-03-29

## 2024-01-07 ENCOUNTER — Other Ambulatory Visit: Payer: Self-pay | Admitting: Family Medicine

## 2024-02-26 ENCOUNTER — Other Ambulatory Visit: Payer: Self-pay | Admitting: Family Medicine

## 2024-02-26 DIAGNOSIS — E039 Hypothyroidism, unspecified: Secondary | ICD-10-CM

## 2024-03-28 ENCOUNTER — Other Ambulatory Visit: Payer: Self-pay | Admitting: Family Medicine

## 2024-03-28 DIAGNOSIS — M5126 Other intervertebral disc displacement, lumbar region: Secondary | ICD-10-CM

## 2024-05-04 ENCOUNTER — Encounter: Admitting: Obstetrics and Gynecology

## 2024-05-05 ENCOUNTER — Other Ambulatory Visit: Payer: Self-pay

## 2024-05-05 MED ORDER — METHYLPREDNISOLONE 4 MG PO TBPK: ORAL_TABLET | ORAL | 0 refills | Status: AC

## 2024-05-05 MED ORDER — ZITHROMAX 1 G PO PACK: 1.0000 g | PACK | Freq: Once | ORAL | 0 refills | Status: DC

## 2024-05-05 MED ORDER — AZITHROMYCIN 250 MG PO TABS: ORAL_TABLET | ORAL | 0 refills | Status: AC

## 2024-05-18 ENCOUNTER — Encounter: Admitting: Family Medicine

## 2024-05-26 ENCOUNTER — Other Ambulatory Visit: Payer: Self-pay | Admitting: Family Medicine

## 2024-05-26 DIAGNOSIS — E039 Hypothyroidism, unspecified: Secondary | ICD-10-CM

## 2024-05-29 ENCOUNTER — Other Ambulatory Visit: Payer: Self-pay | Admitting: Family Medicine

## 2024-05-29 DIAGNOSIS — M5126 Other intervertebral disc displacement, lumbar region: Secondary | ICD-10-CM

## 2024-06-01 ENCOUNTER — Encounter: Admitting: Family Medicine

## 2024-06-15 ENCOUNTER — Encounter: Admitting: Obstetrics and Gynecology

## 2024-07-27 ENCOUNTER — Other Ambulatory Visit: Payer: Self-pay | Admitting: Family Medicine

## 2024-07-27 DIAGNOSIS — M5126 Other intervertebral disc displacement, lumbar region: Secondary | ICD-10-CM

## 2024-08-16 ENCOUNTER — Encounter: Admitting: Obstetrics and Gynecology

## 2024-08-17 ENCOUNTER — Encounter: Admitting: Family Medicine

## 2024-08-17 ENCOUNTER — Encounter: Payer: Self-pay | Admitting: Family Medicine

## 2024-08-20 ENCOUNTER — Other Ambulatory Visit: Payer: Self-pay | Admitting: Family Medicine

## 2024-08-20 DIAGNOSIS — E039 Hypothyroidism, unspecified: Secondary | ICD-10-CM

## 2024-10-08 ENCOUNTER — Encounter: Admitting: Obstetrics and Gynecology

## 2024-10-12 ENCOUNTER — Encounter: Admitting: Obstetrics and Gynecology
